# Patient Record
Sex: Male | Born: 1995 | Race: White | Hispanic: Yes | Marital: Single
Health system: Southern US, Community
[De-identification: ages and names within clinical notes are randomized; demographics above are authoritative.]

---
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
## 2015-12-22 ENCOUNTER — Emergency Department (HOSPITAL_COMMUNITY)
Admission: EM | Admit: 2015-12-22 | Discharge: 2015-12-22 | Disposition: A | Discharge status: Home / Self Care | Payer: Medicaid Other | Attending: Emergency Medicine | Admitting: Emergency Medicine

## 2015-12-22 ENCOUNTER — Encounter (HOSPITAL_COMMUNITY): Payer: Self-pay | Admitting: Emergency Medicine

## 2015-12-22 DIAGNOSIS — L259 Unspecified contact dermatitis, unspecified cause: Secondary | ICD-10-CM

## 2015-12-22 DIAGNOSIS — Z792 Long term (current) use of antibiotics: Secondary | ICD-10-CM

## 2015-12-22 DIAGNOSIS — F1290 Cannabis use, unspecified, uncomplicated: Secondary | ICD-10-CM

## 2015-12-22 DIAGNOSIS — L237 Allergic contact dermatitis due to plants, except food: Principal | ICD-10-CM

## 2015-12-22 MED ORDER — CEPHALEXIN 500 MG PO CAPS
500.0000 mg | ORAL_CAPSULE | Freq: Once | ORAL | Status: AC
  Administered 2015-12-22: 500 mg via ORAL
  Filled 2015-12-22: qty 1

## 2015-12-22 MED ORDER — CEPHALEXIN 500 MG PO CAPS: 500.0000 mg | ORAL_CAPSULE | Freq: Three times a day (TID) | ORAL | 0 refills | Status: DC

## 2015-12-22 MED ORDER — PREDNISONE 20 MG PO TABS: 40.0000 mg | ORAL_TABLET | Freq: Every day | ORAL | 0 refills | Status: DC

## 2015-12-22 MED ORDER — PREDNISONE 20 MG PO TABS
40.0000 mg | ORAL_TABLET | Freq: Once | ORAL | Status: AC
  Administered 2015-12-22: 40 mg via ORAL
  Filled 2015-12-22: qty 2

## 2015-12-22 NOTE — Discharge Instructions (Signed)
Take both medications as prescribed until you finish both bottles. Keep the rash covered and use an antibiotic ointment to prevent infection. Do not scratch or pop the blisters. Follow up with dermatology if your rash does not improve.

## 2015-12-22 NOTE — ED Provider Notes (Signed)
[facility]-EMERGENCY DEPT Provider Note   CSN: [id number] Arrival date & time: 12/22/15  [id number]  By signing my name below, I, Nelwyn SalisburyJoshua Fowler, attest that this documentation has been prepared under the direction and in the presence of non-physician practitioner, Noelle PennerSerena Lene Mckay, PA-C. Electronically Signed: Nelwyn SalisburyJoshua Fowler, Scribe. 12/22/2015. 4:47 PM.   History   Chief Complaint Chief Complaint  Patient presents with  . Rash   The history is provided by the patient. No language interpreter was used.    HPI Comments:  Dylan Meyer is a 20 y.o. male who presents to the Emergency Department complaining of constant worsening right forearm rash onset 4 days ago. He reports associated redness and swelling to the area. He denies itching, fever, chills or pain. He has tried topical cream to relieve symptoms with no relief. He reports the rash started small, but worsened when he scratched his arm popped one of the blisters. Pt states that he was working on a room when he stepped down from a ladder and made contact with an unknown plant. He reports symptoms appeared the next day.  States it does not itch and is not painful.  History reviewed. No pertinent past medical history.  There are no active problems to display for this patient.   History reviewed. No pertinent surgical history.  Home Medications    Prior to Admission medications   Medication Sig Start Date End Date Taking? Authorizing Provider  azithromycin (ZITHROMAX) 250 MG tablet Take 1 tablet (250 mg total) by mouth daily. Take first 2 tablets together, then 1 every day until finished. 12/30/12   Tiffany Neva SeatGreene, PA-C  guaiFENesin-codeine 100-10 MG/5ML syrup Take 5 mLs by mouth at bedtime as needed for cough. 12/30/12   Marlon Peliffany Greene, PA-C    Family History No family history on file.  Social History Social History  Substance Use Topics  . Smoking status: Never Smoker  . Smokeless tobacco: Never Used  . Alcohol use 3.0 oz/week    5 Shots  of liquor per week     Allergies   Review of patient's allergies indicates no known allergies.   Review of Systems Review of Systems  Constitutional: Negative for chills and fever.  Skin: Positive for rash.   10 Systems reviewed and are negative for acute change except as noted in the HPI.  Physical Exam Updated Vital Signs BP 125/75 (BP Location: Right Arm)   Pulse 80   Temp 98.2 F (36.8 C) (Oral)   Resp 18   Ht 5\' 11"  (1.803 m)   Wt 225 lb (102.1 kg)   SpO2 100%   BMI 31.38 kg/m   Physical Exam  Constitutional: He is oriented to person, place, and time. He appears well-developed and well-nourished. No distress.  HENT:  Head: Normocephalic and atraumatic.  Eyes: Conjunctivae are normal.  Cardiovascular: Normal rate.   Pulmonary/Chest: Effort normal.  Abdominal: He exhibits no distension.  Neurological: He is alert and oriented to person, place, and time.  Skin:  Right dorsal forearm covered from elbow to wrist in large erythematous plaque with overlying vesicles, some in clusters. Some have ruptured no crusting. No pustules. Few more scattered vesicles on back of hand and on posterior elbow. Nontender. Pt states is not itchy. No other rashes.  Psychiatric: He has a normal mood and affect.  Nursing note and vitals reviewed.    ED Treatments / Results  DIAGNOSTIC STUDIES:  Oxygen Saturation is 100% on RA, normal by my interpretation.    COORDINATION OF CARE:  4:47 PM Discussed treatment plan with pt at bedside which included prednisone, bacitracin, and keflex and pt agreed to plan.  Labs (all labs ordered are listed, but only abnormal results are displayed) Labs Reviewed - No data to display  EKG  EKG Interpretation None       Radiology No results found.  Procedures Procedures (including critical care time)  Medications Ordered in ED Medications - No data to display   Initial Impression / Assessment and Plan / ED Course  I have reviewed the  triage vital signs and the nursing notes.  Pertinent labs & imaging results that were available during my care of the patient were reviewed by me and considered in my medical decision making (see chart for details).  Clinical Course    Based on history and distribution certainly seems like contact dermatitis, especially since it spread after pt scratched initial eruption. However, without significant itching seems unlikely to be poison ivy, poison oak. Possibly sumac? Does not appear infectious (e.g. Don't think shingles) but will cover for superimposed bacterial infection with keflex. Instructed to avoid offending agent, keep covered with bacitracin, and to use unscented soaps, lotions, and detergents. Will treat with prednisone and keflex.  No signs of secondary infection. Follow up with PCP or derm in 2-3 days. Return precautions discussed. Pt is safe for discharge at this time.  Final Clinical Impressions(s) / ED Diagnoses   Final diagnoses:  Contact dermatitis  Plant allergic contact dermatitis    New Prescriptions Discharge Medication List as of 12/22/2015  5:28 PM    START taking these medications   Details  cephALEXin (KEFLEX) 500 MG capsule Take 1 capsule (500 mg total) by mouth 3 (three) times daily., Starting Sat 12/22/2015, Print    predniSONE (DELTASONE) 20 MG tablet Take 2 tablets (40 mg total) by mouth daily., Starting Sat 12/22/2015, Print      I personally performed the services described in this documentation, which was scribed in my presence. The recorded information has been reviewed and is accurate.   Carlene CoriaSerena Y Loredana Medellin, PA-C 12/22/15 1836    Derwood KaplanAnkit Nanavati, MD 12/23/15 [id number]

## 2015-12-22 NOTE — ED Triage Notes (Signed)
Pt from home with what he states is poison ivy on his right forearm. Pt has blister like areas around the entire rash which covers most of the medial side of his forearm. Pt denies pain or itching on the area. Pt states he has been putting oil on the area, but is unsure what the oil is. Pt states the rash appeared on Wed

## 2019-01-26 ENCOUNTER — Other Ambulatory Visit: Payer: Self-pay

## 2019-01-26 ENCOUNTER — Encounter (HOSPITAL_COMMUNITY): Payer: Self-pay

## 2019-01-26 DIAGNOSIS — F1210 Cannabis abuse, uncomplicated: Secondary | ICD-10-CM | POA: Insufficient documentation

## 2019-01-26 DIAGNOSIS — N451 Epididymitis: Secondary | ICD-10-CM | POA: Insufficient documentation

## 2019-01-26 DIAGNOSIS — Z202 Contact with and (suspected) exposure to infections with a predominantly sexual mode of transmission: Secondary | ICD-10-CM | POA: Insufficient documentation

## 2019-01-26 NOTE — ED Triage Notes (Signed)
Pt reports R testicle pain x 2.5 weeks. Endorses swelling, denies any hard lumps. Denies urinary symptoms. States that he was being seen at a clinic, but he never got his urine results.

## 2019-01-27 ENCOUNTER — Emergency Department (HOSPITAL_COMMUNITY): Payer: Self-pay

## 2019-01-27 ENCOUNTER — Emergency Department (HOSPITAL_COMMUNITY)
Admission: EM | Admit: 2019-01-27 | Discharge: 2019-01-27 | Disposition: A | Discharge status: Home / Self Care | Payer: Self-pay | Attending: Emergency Medicine | Admitting: Emergency Medicine

## 2019-01-27 ENCOUNTER — Other Ambulatory Visit: Payer: Self-pay | Admitting: Emergency Medicine

## 2019-01-27 DIAGNOSIS — N451 Epididymitis: Secondary | ICD-10-CM

## 2019-01-27 DIAGNOSIS — Z711 Person with feared health complaint in whom no diagnosis is made: Secondary | ICD-10-CM

## 2019-01-27 LAB — URINALYSIS, ROUTINE W REFLEX MICROSCOPIC
Bilirubin Urine: NEGATIVE
Glucose, UA: NEGATIVE mg/dL
Ketones, ur: NEGATIVE mg/dL
Leukocytes,Ua: NEGATIVE
Nitrite: NEGATIVE
Protein, ur: NEGATIVE mg/dL
Specific Gravity, Urine: 1.03 (ref 1.005–1.030)
pH: 5 (ref 5.0–8.0)

## 2019-01-27 MED ORDER — AZITHROMYCIN 250 MG PO TABS
1000.0000 mg | ORAL_TABLET | Freq: Once | ORAL | Status: AC
  Administered 2019-01-27: 01:00:00 1000 mg via ORAL
  Filled 2019-01-27: qty 4

## 2019-01-27 MED ORDER — STERILE WATER FOR INJECTION IJ SOLN
INTRAMUSCULAR | Status: AC
  Administered 2019-01-27: 01:00:00 0.9 mL
  Filled 2019-01-27: qty 10

## 2019-01-27 MED ORDER — CEFTRIAXONE SODIUM 250 MG IJ SOLR
250.0000 mg | Freq: Once | INTRAMUSCULAR | Status: AC
  Administered 2019-01-27: 01:00:00 250 mg via INTRAMUSCULAR
  Filled 2019-01-27: qty 250

## 2019-01-27 MED ORDER — DOXYCYCLINE HYCLATE 100 MG PO CAPS
100.0000 mg | ORAL_CAPSULE | Freq: Two times a day (BID) | ORAL | 0 refills | Status: AC
Start: 2019-01-27 — End: 2019-02-06

## 2019-01-27 NOTE — ED Provider Notes (Addendum)
[facility]-EMERGENCY DEPT Provider Note   CSN: [id number] Arrival date & time: 01/26/19  1959     History   Chief Complaint Chief Complaint  Patient presents with  . Testicle Pain    HPI Dylan Meyer is a 23 y.o. male presents today for evaluation of right-sided posterior testicular pain for the last 2-1/2 weeks.  Patient admits that he has had one unprotected sexual encounter with a male before symptoms began.  He went to an outside clinic a week and a half ago where they got a urine sample and gave him a medicine for his symptoms that started with an in and had an accident but he does not remember the name exactly.  He took this medicine and while he was taking it his pain improved but he finished the course and the pain returned.  He called the clinic to follow-up on his urine test and they told him that they had actually never sent it out for testing.  Eventually, the clinic called them and told him that he had "mild gonorrhea".  States they never treated it informed.  His friend told him to come to the ER because the test would be done quicker.  He had a quick "twinge" to the right lower abdomen while he was waiting in the waiting room but this has completely resolved.  He has no associated fever, chills, nausea, vomiting, dysuria, hematuria, penile discharge, genital lesions.  States initially he felt like his right testicle was swollen but this has resolved.  No previous history of STDs.     HPI  History reviewed. No pertinent past medical history.  There are no active problems to display for this patient.   History reviewed. No pertinent surgical history.      Home Medications    Prior to Admission medications   Medication Sig Start Date End Date Taking? Authorizing Provider  naproxen (NAPROSYN) 500 MG tablet Take 500 mg by mouth 2 (two) times daily with a meal.   Yes [provider]  azithromycin (ZITHROMAX) 250 MG tablet Take 1 tablet  (250 mg total) by mouth daily. Take first 2 tablets together, then 1 every day until finished. Patient not taking: Reported on 01/27/2019 12/30/12   Marlon Pel, PA-C  cephALEXin (KEFLEX) 500 MG capsule Take 1 capsule (500 mg total) by mouth 3 (three) times daily. Patient not taking: Reported on 01/27/2019 12/22/15   Sam, Ace Gins, PA-C  doxycycline (VIBRAMYCIN) 100 MG capsule Take 1 capsule (100 mg total) by mouth 2 (two) times daily for 10 days. 01/27/19 02/06/19  Liberty Handy, PA-C  guaiFENesin-codeine 100-10 MG/5ML syrup Take 5 mLs by mouth at bedtime as needed for cough. Patient not taking: Reported on 01/27/2019 12/30/12   Marlon Pel, PA-C  predniSONE (DELTASONE) 20 MG tablet Take 2 tablets (40 mg total) by mouth daily. Patient not taking: Reported on 01/27/2019 12/22/15   Carlene Coria, PA-C    Family History History reviewed. No pertinent family history.  Social History Social History   Tobacco Use  . Smoking status: Never Smoker  . Smokeless tobacco: Never Used  Substance Use Topics  . Alcohol use: Yes    Alcohol/week: 5.0 standard drinks    Types: 5 Shots of liquor per week  . Drug use: Yes    Types: Marijuana     Allergies   Patient has no known allergies.   Review of Systems Review of Systems  Genitourinary: Positive for testicular pain.  All  other systems reviewed and are negative.    Physical Exam Updated Vital Signs BP 130/67   Pulse 64   Temp 98.4 F (36.9 C) (Oral)   Resp 16   SpO2 100%   Physical Exam Vitals signs and nursing note reviewed.  Constitutional:      General: He is not in acute distress.    Appearance: He is well-developed.     Comments: NAD.  HENT:     Head: Normocephalic and atraumatic.     Right Ear: External ear normal.     Left Ear: External ear normal.     Nose: Nose normal.  Eyes:     General: No scleral icterus.    Conjunctiva/sclera: Conjunctivae normal.  Neck:     Musculoskeletal: Normal range of motion and  neck supple.  Cardiovascular:     Rate and Rhythm: Normal rate and regular rhythm.     Heart sounds: Normal heart sounds. No murmur.  Pulmonary:     Effort: Pulmonary effort is normal.     Breath sounds: Normal breath sounds. No wheezing.  Abdominal:     Palpations: Abdomen is soft.     Tenderness: There is no abdominal tenderness.     Comments: No lower abdominal tenderness.  No suprapubic or CVA tenderness.  Genitourinary:    Penis: Uncircumcised.      Scrotum/Testes:        Right: Tenderness present.     Comments:  Exam performed with EMT Carmen at bedside External genitalia without erythema, edema or lesions.  Uncircumcised male. No groin lymphadenopathy.  No meatus discharge. Glans and shaft smooth without tenderness, lesions, masses or deformity.  Scrotum without lesions or edema. Non tender LEFT testicle, no pain with traction. Posterior RIGHT testicular, epididymis and spermatic cord tenderness, pain with traction. Cremasteric reflex intact. Musculoskeletal: Normal range of motion.        General: No deformity.  Skin:    General: Skin is warm and dry.     Capillary Refill: Capillary refill takes less than 2 seconds.  Neurological:     Mental Status: He is alert and oriented to person, place, and time.  Psychiatric:        Behavior: Behavior normal.        Thought Content: Thought content normal.        Judgment: Judgment normal.      ED Treatments / Results  Labs (all labs ordered are listed, but only abnormal results are displayed) Labs Reviewed  URINALYSIS, ROUTINE W REFLEX MICROSCOPIC - Abnormal; Notable for the following components:      Result Value   Hgb urine dipstick SMALL (*)    Bacteria, UA RARE (*)    All other components within normal limits  URINE CULTURE  GC/CHLAMYDIA PROBE AMP ([place]) NOT [facility]    EKG None  Radiology [place] Scrotum W/doppler  Result Date: 01/27/2019 CLINICAL DATA:  Initial evaluation for acute right testicular pain.  EXAM: SCROTAL ULTRASOUND DOPPLER ULTRASOUND OF THE TESTICLES TECHNIQUE: Complete ultrasound examination of the testicles, epididymis, and other scrotal structures was performed. Color and spectral Doppler ultrasound were also utilized to evaluate blood flow to the testicles. COMPARISON:  None. FINDINGS: Right testicle Measurements: 4.3 x 2.4 x 2.9 cm. No mass or microlithiasis visualized. Left testicle Measurements: 3.9 x 2.0 x 3.2 cm. No mass or microlithiasis visualized. Right epididymis:  Normal in size and appearance. Left epididymis:  Normal in size and appearance. Hydrocele:  None visualized. Varicocele:  None visualized.  Pulsed Doppler interrogation of both testes demonstrates normal low resistance arterial and venous waveforms bilaterally. IMPRESSION: Normal scrotal ultrasound. No evidence for torsion or other acute abnormality. Electronically Signed   By: Jeannine Boga M.D.   On: 01/27/2019 02:27    Procedures Procedures (including critical care time)  Medications Ordered in ED Medications  cefTRIAXone (ROCEPHIN) injection 250 mg (250 mg Intramuscular Given 01/27/19 0124)  azithromycin (ZITHROMAX) tablet 1,000 mg (1,000 mg Oral Given 01/27/19 0123)  sterile water (preservative free) injection (0.9 mLs  Given 01/27/19 0127)     Initial Impression / Assessment and Plan / ED Course  I have reviewed the triage vital signs and the nursing notes.  Pertinent labs & imaging results that were available during my care of the patient were reviewed by me and considered in my medical decision making (see chart for details).  Symptoms likely from infectious epididymitis from an STD.  Attempted to review patient's EMR but unable to obtain records from the outside clinic he went to.  States he was told he had "mild gonorrhea".  It does not sound like he actually got treated for it.  Sounds like he was discharged with naproxen.  Ultrasound is negative for torsion or other anatomical abnormalities on  the testicle.  UA without signs of infection, trichomonas.  Gonorrhea and chlamydia swabs obtained by me and pending.  He was empirically treated with Rocephin and azithromycin.  I offered HIV and syphilis testing but he declined.  History and exam is not consistent with torsion.  There is no signs of skin infection, cellulitis or abscess on exam.  No signs of foreign years.  No obvious hernias.  He has no abdominal tenderness or inguinal bulging.  Will discharge with NSAIDs, doxycycline for suspected epididymitis.  Instructed patient to avoid sexual encounters for the next 10 days.  He is to notify all sexual partners about his symptoms.  Return precautions given.  Patient comfortable with this.  0345: I realized I did not prescribed doxycycline by time patient was discharged.  Rx for doxy called into pharmacy.  I called patient and left a voicemail to notify.  Final Clinical Impressions(s) / ED Diagnoses   Final diagnoses:  Epididymitis  Concern about STD in male without diagnosis    ED Discharge Orders         Ordered    doxycycline (VIBRAMYCIN) 100 MG capsule  2 times daily     01/27/19 0340             Kinnie Feil, PA-C 01/27/19 7371    Rolland Porter, MD 01/27/19 [phone]

## 2019-01-27 NOTE — Discharge Instructions (Addendum)
You were seen in the ER for right testicular pain.  Urinalysis obtained here does not show any infection.  Gonorrhea and Chlamydia tests are pending.  These usually come back in the next 48 to 72 hours.  You were treated for these infections given your recent exposure to unprotected sex.  No further treatment is needed for gonorrhea and chlamydia.  Someone should call you with your test results if they are positive.  Check my chart account to follow-up in your results.  Ultrasound of your testicles is normal.  I suspect your pain is from an infection to your epididymis which is in the back of your testicle.  Alternate ibuprofen and Tylenol every 6-8 hours for pain.  Return to the ER for fever, chills, nausea, vomiting, abdominal pain, worsening testicular pain, burning with urination, blood in your urine, penile discharge.  Do not have sexual encounters for the next 10 days after receiving treatment today.  You should contact all of your sexual partners to let them know they have been tested for STDs and not having some symptoms as they will need to get tested and treated.

## 2019-01-28 ENCOUNTER — Encounter (HOSPITAL_COMMUNITY): Payer: Self-pay | Admitting: *Deleted

## 2019-01-28 LAB — URINE CULTURE: Culture: NO GROWTH

## 2019-01-28 LAB — CYTOLOGY, (ORAL, ANAL, URETHRAL) ANCILLARY ONLY
Chlamydia: NEGATIVE
Neisseria Gonorrhea: NEGATIVE

## 2019-02-22 ENCOUNTER — Other Ambulatory Visit: Payer: Self-pay

## 2019-02-22 ENCOUNTER — Emergency Department (HOSPITAL_COMMUNITY): Payer: Self-pay

## 2019-02-22 ENCOUNTER — Encounter (HOSPITAL_COMMUNITY): Payer: Self-pay | Admitting: *Deleted

## 2019-02-22 ENCOUNTER — Emergency Department (HOSPITAL_COMMUNITY)
Admission: EM | Admit: 2019-02-22 | Discharge: 2019-02-22 | Disposition: A | Discharge status: Home / Self Care | Payer: Self-pay | Attending: Emergency Medicine | Admitting: Emergency Medicine

## 2019-02-22 DIAGNOSIS — N50812 Left testicular pain: Secondary | ICD-10-CM | POA: Insufficient documentation

## 2019-02-22 DIAGNOSIS — N50811 Right testicular pain: Secondary | ICD-10-CM | POA: Insufficient documentation

## 2019-02-22 DIAGNOSIS — Z79899 Other long term (current) drug therapy: Secondary | ICD-10-CM | POA: Insufficient documentation

## 2019-02-22 LAB — URINALYSIS, ROUTINE W REFLEX MICROSCOPIC
Bilirubin Urine: NEGATIVE
Glucose, UA: NEGATIVE mg/dL
Hgb urine dipstick: NEGATIVE
Ketones, ur: NEGATIVE mg/dL
Leukocytes,Ua: NEGATIVE
Nitrite: NEGATIVE
Protein, ur: NEGATIVE mg/dL
Specific Gravity, Urine: 1.02 (ref 1.005–1.030)
pH: 5 (ref 5.0–8.0)

## 2019-02-22 MED ORDER — KETOROLAC TROMETHAMINE 15 MG/ML IJ SOLN
15.0000 mg | Freq: Once | INTRAMUSCULAR | Status: AC
  Administered 2019-02-22: 20:00:00 15 mg via INTRAMUSCULAR
  Filled 2019-02-22: qty 1

## 2019-02-22 NOTE — ED Provider Notes (Signed)
[facility]   CSN: [id number] Arrival date & time: 02/22/19  1833     History   Chief Complaint Chief Complaint  Patient presents with  . SEXUALLY TRANSMITTED DISEASE    HPI Dylan Meyer is a 23 y.o. male.     HPI 23 year old male presents to the ER for evaluation of bilateral testicular pain.  Has been ongoing intermittently for the past 2 weeks.  Patient was seen in the ER on 9/23 for the same pain.  He was diagnosed with possible epididymitis at this time.  Ultrasound was reassuring.  Patient states that he was sexually active without any protection.  Was treated for gonorrhea and chlamydia.  Culture results were never completed.  Patient states that the pain in his testicles is bilateral.  He states that it will come and go.  He states that he has been sexually active since his last ER visit however he did use protection.  Denies any testicular swelling.  Denies any penile discharge.  Denies any fever, chills or significant abdominal pain.  He has tried no medications for his pain prior to arrival. History reviewed. No pertinent past medical history.  There are no active problems to display for this patient.   History reviewed. No pertinent surgical history.      Home Medications    Prior to Admission medications   Medication Sig Start Date End Date Taking? Authorizing Provider  azithromycin (ZITHROMAX) 250 MG tablet Take 1 tablet (250 mg total) by mouth daily. Take first 2 tablets together, then 1 every day until finished. Patient not taking: Reported on 01/27/2019 12/30/12   Marlon Pel, PA-C  cephALEXin (KEFLEX) 500 MG capsule Take 1 capsule (500 mg total) by mouth 3 (three) times daily. Patient not taking: Reported on 01/27/2019 12/22/15   Sam, Ace Gins, PA-C  guaiFENesin-codeine 100-10 MG/5ML syrup Take 5 mLs by mouth at bedtime as needed for cough. Patient not taking: Reported on 01/27/2019 12/30/12   Marlon Pel, PA-C  naproxen (NAPROSYN) 500 MG tablet Take 500 mg by mouth 2 (two) times daily with a meal.    [provider]  predniSONE (DELTASONE) 20 MG tablet Take 2 tablets (40 mg total) by mouth daily. Patient not taking: Reported on 01/27/2019 12/22/15   Carlene Coria, PA-C    Family History No family history on file.  Social History Social History   Tobacco Use  . Smoking status: Never Smoker  . Smokeless tobacco: Never Used  Substance Use Topics  . Alcohol use: Yes    Alcohol/week: 5.0 standard drinks    Types: 5 Shots of liquor per week  . Drug use: Yes    Types: Marijuana     Allergies   Patient has no known allergies.   Review of Systems Review of Systems  Constitutional: Negative for chills and fever.  HENT: Negative for congestion.   Eyes: Negative for discharge.  Respiratory: Negative for cough.   Gastrointestinal: Negative for nausea and vomiting.  Genitourinary: Positive for testicular pain. Negative for decreased urine volume, difficulty urinating, discharge, dysuria, flank pain, frequency, hematuria, penile pain, scrotal swelling and urgency.  Musculoskeletal: Negative for myalgias.  Skin: Negative for color change.  Psychiatric/Behavioral: Negative for confusion.     Physical Exam Updated Vital Signs BP 139/66 (BP Location: Right Arm)   Pulse 74   Temp 99.2 F (37.3 C) (Oral)   Resp 15   Ht 5\' 9"  (1.753 m)   Wt 99.8  kg   SpO2 100%   BMI 32.49 kg/m   Physical Exam Vitals signs and nursing note reviewed.  Constitutional:      General: He is not in acute distress.    Appearance: He is well-developed. He is not ill-appearing or toxic-appearing.  HENT:     Head: Normocephalic and atraumatic.  Eyes:     General: No scleral icterus.       Right eye: No discharge.        Left eye: No discharge.  Neck:     Musculoskeletal: Normal range of motion.  Pulmonary:     Effort: No respiratory distress.  Abdominal:     General: Abdomen is  flat. Bowel sounds are normal. There is no distension.     Palpations: Abdomen is soft. There is no mass.     Tenderness: There is no abdominal tenderness. There is no right CVA tenderness, left CVA tenderness, guarding or rebound.     Hernia: No hernia is present.  Genitourinary:    Comments: Chaperone present for exam. uncirumcised male. No penile discharge, erythema, tenderness, lesion, or rash. 2 descended testes without swelling, pain, lesions or rash. No inguinal lymphadenopathy or hernia.   Musculoskeletal: Normal range of motion.  Skin:    General: Skin is warm and dry.     Capillary Refill: Capillary refill takes less than 2 seconds.     Coloration: Skin is not pale.  Neurological:     Mental Status: He is alert.  Psychiatric:        Behavior: Behavior normal.        Thought Content: Thought content normal.        Judgment: Judgment normal.      ED Treatments / Results  Labs (all labs ordered are listed, but only abnormal results are displayed) Labs Reviewed  URINALYSIS, ROUTINE W REFLEX MICROSCOPIC - Abnormal; Notable for the following components:      Result Value   Color, Urine STRAW (*)    APPearance HAZY (*)    All other components within normal limits  GC/CHLAMYDIA PROBE AMP ([place]) NOT AT [facility]    EKG None  Radiology [place] Scrotum W/doppler  Result Date: 02/22/2019 CLINICAL DATA:  Left testicular pain for 10 days EXAM: SCROTAL ULTRASOUND DOPPLER ULTRASOUND OF THE TESTICLES TECHNIQUE: Complete ultrasound examination of the testicles, epididymis, and other scrotal structures was performed. Color and spectral Doppler ultrasound were also utilized to evaluate blood flow to the testicles. COMPARISON:  Scrotal ultrasound 01/27/2019 FINDINGS: Right testicle Measurements: 4.3 x 2.4 x 3.1 cm. No mass or microlithiasis visualized. Left testicle Measurements: 4.4 x 2.8 x 2.9 cm. No mass or microlithiasis visualized. Right epididymis: 3 mm anechoic epididymal head cyst.  Otherwise normal size and appearance. Left epididymis:  Normal in size and appearance. Hydrocele:  None visualized. Varicocele:  None visualized. Pulsed Doppler interrogation of both testes demonstrates normal low resistance arterial and venous waveforms bilaterally. IMPRESSION: No evidence of testicular mass, torsion or other acute scrotal abnormality. Small anechoic right epididymal head cyst. Electronically Signed   By: Kreg ShropshirePrice  DeHay M.D.   On: 02/22/2019 20:59    Procedures Procedures (including critical care time)  Medications Ordered in ED Medications  ketorolac (TORADOL) 15 MG/ML injection 15 mg (15 mg Intramuscular Given 02/22/19 2014)     Initial Impression / Assessment and Plan / ED Course  I have reviewed the triage vital signs and the nursing notes.  Pertinent labs & imaging results that were available during my  care of the patient were reviewed by me and considered in my medical decision making (see chart for details).        23 year old male with ongoing testicular pain presents to the ER for further evaluation.  Treated with Rocephin and doxycycline 3 weeks ago for presumed STD however cultures were never obtained.  Ultrasound was reassuring at that time.  Patient said the testicular pain has persisted.  He denies any unprotected sex since his last antibiotic treatment.  On exam patient has no testicular pain or swelling.  There is no penile discharge or rashes noted.  Gonorrhea and Chlamydia cultures were obtained and they are pending at this time.  UA shows no signs of infection.  Ultrasound was obtained that shows no evidence of torsion or epididymitis.  Does note epididymal head cyst.  Unknown etiology of patient's symptoms.  No signs of UTI.  Doubt nephrolithiasis.  Patient has no systemic signs of infection.  Urine shows no signs of infection.  Will treat with anti-inflammatories, scrotal support and ice application.  Will give urology follow-up.  Given the patient was  treated with antibiotics 3 weeks ago do not feel that he needs retreatment and will wait for GC chlamydia cultures.  Pt is hemodynamically stable, in NAD, & able to ambulate in the ED. Evaluation does not show pathology that would require ongoing emergent intervention or inpatient treatment. I explained the diagnosis to the patient. Pain has been managed & has no complaints prior to dc. Pt is comfortable with above plan and is stable for discharge at this time. All questions were answered prior to disposition. Strict return precautions for f/u to the ED were discussed. Encouraged follow up with PCP.   Final Clinical Impressions(s) / ED Diagnoses   Final diagnoses:  Pain in both testicles    ED Discharge Orders    None       Aaron Edelman 02/22/19 2136    Julianne Rice, MD 02/22/19 2229

## 2019-02-22 NOTE — ED Triage Notes (Signed)
Left side testicle pain, no swelling, painful, states he did not follow instructions from the last time he was here

## 2019-02-22 NOTE — Discharge Instructions (Signed)
Your ultrasound showed no signs of swelling to your testicles.  You do have good blood flow and no signs of twisting of the testicles.  You do have a small cyst to your right testicle epididymis this is not likely causing your symptoms.  Given that you were treated for an STD several weeks ago and has had not had any unprotected sex since then I will not treat at this time and await for your cultures of your gonorrhea chlamydia.  Your urine showed no signs of infection.  I would recommend anti-inflammatories.  Avoid sexual intercourse for the next 14 days to allow your symptoms to improve.  Motrin and Tylenol for pain.  Can use a scrotal support to help with the pain and apply ice to help with any swelling.  Have given you urology follow-up and return precautions were discussed.

## 2019-02-24 LAB — GC/CHLAMYDIA PROBE AMP (~~LOC~~) NOT AT ARMC
Chlamydia: NEGATIVE
Neisseria Gonorrhea: NEGATIVE

## 2019-03-01 ENCOUNTER — Emergency Department (HOSPITAL_COMMUNITY)
Admission: EM | Admit: 2019-03-01 | Discharge: 2019-03-02 | Disposition: A | Discharge status: Home / Self Care | Payer: Self-pay | Attending: Emergency Medicine | Admitting: Emergency Medicine

## 2019-03-01 ENCOUNTER — Encounter (HOSPITAL_COMMUNITY): Payer: Self-pay | Admitting: Family Medicine

## 2019-03-01 DIAGNOSIS — F10929 Alcohol use, unspecified with intoxication, unspecified: Secondary | ICD-10-CM | POA: Insufficient documentation

## 2019-03-01 DIAGNOSIS — Y906 Blood alcohol level of 120-199 mg/100 ml: Secondary | ICD-10-CM | POA: Insufficient documentation

## 2019-03-01 DIAGNOSIS — F4321 Adjustment disorder with depressed mood: Secondary | ICD-10-CM | POA: Insufficient documentation

## 2019-03-01 LAB — ETHANOL: Alcohol, Ethyl (B): 172 mg/dL — ABNORMAL HIGH (ref <10)

## 2019-03-01 LAB — BASIC METABOLIC PANEL
Anion gap: 13 (ref 5–15)
BUN: 8 mg/dL (ref 6–20)
CO2: 20 mmol/L — ABNORMAL LOW (ref 22–32)
Calcium: 9.3 mg/dL (ref 8.9–10.3)
Chloride: 107 mmol/L (ref 98–111)
Creatinine, Ser: 0.92 mg/dL (ref 0.61–1.24)
GFR calc Af Amer: >60 mL/min (ref >60)
GFR calc non Af Amer: >60 mL/min (ref >60)
Glucose, Bld: 187 mg/dL — ABNORMAL HIGH (ref 70–99)
Potassium: 2.6 mmol/L — CL (ref 3.5–5.1)
Sodium: 140 mmol/L (ref 135–145)

## 2019-03-01 LAB — CBC
HCT: 42.3 % (ref 39.0–52.0)
Hemoglobin: 13.8 g/dL (ref 13.0–17.0)
MCH: 28.4 pg (ref 26.0–34.0)
MCHC: 32.6 g/dL (ref 30.0–36.0)
MCV: 87 fL (ref 80.0–100.0)
Platelets: 322 10*3/uL (ref 150–400)
RBC: 4.86 MIL/uL (ref 4.22–5.81)
RDW: 13.2 % (ref 11.5–15.5)
WBC: 14.5 10*3/uL — ABNORMAL HIGH (ref 4.0–10.5)
nRBC: 0 % (ref 0.0–0.2)

## 2019-03-01 MED ORDER — POTASSIUM CHLORIDE CRYS ER 20 MEQ PO TBCR
40.0000 meq | EXTENDED_RELEASE_TABLET | Freq: Once | ORAL | Status: AC
  Administered 2019-03-01: 40 meq via ORAL
  Filled 2019-03-01: qty 2

## 2019-03-01 MED ORDER — SODIUM CHLORIDE 0.9 % IV SOLN
1000.0000 mL | INTRAVENOUS | Status: DC
  Administered 2019-03-01: 1000 mL via INTRAVENOUS

## 2019-03-01 MED ORDER — POTASSIUM CHLORIDE 10 MEQ/100ML IV SOLN
10.0000 meq | Freq: Once | INTRAVENOUS | Status: AC
  Administered 2019-03-01: 10 meq via INTRAVENOUS
  Filled 2019-03-01: qty 100

## 2019-03-01 MED ORDER — SODIUM CHLORIDE 0.9 % IV BOLUS (SEPSIS)
1000.0000 mL | Freq: Once | INTRAVENOUS | Status: AC
  Administered 2019-03-01: 1000 mL via INTRAVENOUS

## 2019-03-01 MED ORDER — LORAZEPAM 2 MG/ML IJ SOLN
1.0000 mg | Freq: Once | INTRAMUSCULAR | Status: AC
  Administered 2019-03-01: 1 mg via INTRAVENOUS
  Filled 2019-03-01: qty 1

## 2019-03-01 NOTE — ED Provider Notes (Addendum)
[facility]-EMERGENCY DEPT Provider Note   CSN: [id number] Arrival date & time: 03/01/19  2248     History   Chief Complaint Chief Complaint  Patient presents with  . Unresponsive     HPI Dylan Meyer is a 23 y.o. male.     HPI Patient presented to the emergency room for evaluation of a grief reaction.  Patient states a friend of his recently passed away.  Pt is feeling guilty because he did not answer the phone and the friend committed suicide.  Patient states he has been very upset.  He admits to drinking alcohol tonight.  Patient denies suicidal or homicidal ideation. History reviewed. No pertinent past medical history.  There are no active problems to display for this patient.   History reviewed. No pertinent surgical history.      Home Medications    Prior to Admission medications   Medication Sig Start Date End Date Taking? Authorizing Provider  azithromycin (ZITHROMAX) 250 MG tablet Take 1 tablet (250 mg total) by mouth daily. Take first 2 tablets together, then 1 every day until finished. Patient not taking: Reported on 01/27/2019 12/30/12   Marlon Pel, PA-C  cephALEXin (KEFLEX) 500 MG capsule Take 1 capsule (500 mg total) by mouth 3 (three) times daily. Patient not taking: Reported on 01/27/2019 12/22/15   Sam, Ace Gins, PA-C  guaiFENesin-codeine 100-10 MG/5ML syrup Take 5 mLs by mouth at bedtime as needed for cough. Patient not taking: Reported on 01/27/2019 12/30/12   Marlon Pel, PA-C  naproxen (NAPROSYN) 500 MG tablet Take 500 mg by mouth 2 (two) times daily with a meal.    [provider]  predniSONE (DELTASONE) 20 MG tablet Take 2 tablets (40 mg total) by mouth daily. Patient not taking: Reported on 01/27/2019 12/22/15   Carlene Coria, PA-C    Family History History reviewed. No pertinent family history.  Social History Social History   Tobacco Use  . Smoking status: Never Smoker  . Smokeless tobacco: Never  Used  Substance Use Topics  . Alcohol use: Yes    Comment: Last drink: "up to around 22:00".   . Drug use: Yes    Types: Marijuana    Comment: Last used: today      Allergies   Patient has no known allergies.   Review of Systems Review of Systems  All other systems reviewed and are negative.    Physical Exam Updated Vital Signs BP (!) 98/48   Pulse 67   Temp 97.8 F (36.6 C) (Axillary)   Resp 19   Ht 1.753 m (5\' 9" )   Wt 99.8 kg   SpO2 100%   BMI 32.49 kg/m   Physical Exam Vitals signs and nursing note reviewed.  Constitutional:      General: He is not in acute distress.    Appearance: He is well-developed.  HENT:     Head: Normocephalic and atraumatic.     Right Ear: External ear normal.     Left Ear: External ear normal.  Eyes:     General: No scleral icterus.       Right eye: No discharge.        Left eye: No discharge.     Conjunctiva/sclera: Conjunctivae normal.  Neck:     Musculoskeletal: Neck supple.     Trachea: No tracheal deviation.  Cardiovascular:     Rate and Rhythm: Normal rate and regular rhythm.  Pulmonary:     Effort: Pulmonary effort is  normal. No respiratory distress.     Breath sounds: Normal breath sounds. No stridor. No wheezing or rales.  Abdominal:     General: Bowel sounds are normal. There is no distension.     Palpations: Abdomen is soft.     Tenderness: There is no abdominal tenderness. There is no guarding or rebound.  Musculoskeletal:        General: No tenderness.  Skin:    General: Skin is warm and dry.     Findings: No rash.  Neurological:     Mental Status: He is alert.     Cranial Nerves: No cranial nerve deficit (no facial droop, extraocular movements intact, no slurred speech).     Sensory: No sensory deficit.     Motor: No abnormal muscle tone or seizure activity.     Coordination: Coordination normal.     Comments: Initially the patient was more somnolent but now he is alert and agitated  Psychiatric:         Mood and Affect: Mood is anxious and depressed. Affect is labile.        Behavior: Behavior is withdrawn and combative. Behavior is not aggressive or hyperactive.        Thought Content: Thought content does not include homicidal or suicidal ideation.      ED Treatments / Results  Labs (all labs ordered are listed, but only abnormal results are displayed) Labs Reviewed  CBC - Abnormal; Notable for the following components:      Result Value   WBC 14.5 (*)    All other components within normal limits  BASIC METABOLIC PANEL - Abnormal; Notable for the following components:   Potassium 2.6 (*)    CO2 20 (*)    Glucose, Bld 187 (*)    All other components within normal limits  ETHANOL - Abnormal; Notable for the following components:   Alcohol, Ethyl (B) 172 (*)    All other components within normal limits  RAPID URINE DRUG SCREEN, HOSP PERFORMED    EKG None  Radiology No results found.  Procedures Procedures (including critical care time)  Medications Ordered in ED Medications  sodium chloride 0.9 % bolus 1,000 mL (0 mLs Intravenous Stopped 03/01/19 2335)    Followed by  0.9 %  sodium chloride infusion (1,000 mLs Intravenous New Bag/Given (Non-Interop) 03/01/19 2305)  LORazepam (ATIVAN) injection 1 mg (1 mg Intravenous Given 03/01/19 2307)  potassium chloride SA (KLOR-CON) CR tablet 40 mEq (40 mEq Oral Given 03/01/19 2352)  potassium chloride 10 mEq in 100 mL IVPB (0 mEq Intravenous Stopped 03/02/19 0050)     Initial Impression / Assessment and Plan / ED Course  I have reviewed the triage vital signs and the nursing notes.  Pertinent labs & imaging results that were available during my care of the patient were reviewed by me and considered in my medical decision making (see chart for details).  Clinical Course as of Mar 02 115  Tue Mar 01, 2019  2339 Labs reviewed.  Potassium decreased at 2.6.  Alcohol level elevated at 172   [JK]  Wed Mar 02, 2019  0110 Pt was  hyperventilating prior to blood draw.  Suspect that contributed to the low potassium   [JK]  0115 Patient still suicidal ideation.  I asked him if he wanted to speak with a behavioral health specialist and he declined.   [JK]    Clinical Course User Index [JK] Linwood DibblesKnapp, Kirsten Mckone, MD     Patient's laboratory tests  are notable for alcohol intoxication and hypokalemia.  IV and oral potassium ordered.  Patient is calmer now.  He denied suicidal ideation to me but has been very upset about his friend who passed away.  Will continue to monitor until he is more sober.  Final Clinical Impressions(s) / ED Diagnoses   Final diagnoses:  Alcoholic intoxication with complication ([place])  Grief reaction      Dorie Rank, MD 03/01/19 0370    Dorie Rank, MD 03/02/19 [phone]

## 2019-03-01 NOTE — ED Triage Notes (Signed)
Patient was dropped off by his friends. Patient was unresponsive when he arrived, pale, and diaphoretic. When he got to RES A, he became responsive with some stimulation. He reports he has been drinking tonight related to a friend committing suicide recently. He denies wanting to hurt himself or others.

## 2019-03-01 NOTE — ED Notes (Signed)
Pt placed on 3L nasal cannula due to pt desating to 84% on room air.

## 2019-03-02 NOTE — ED Notes (Signed)
Patient ambulated to the restroom with no difficulty   ° °

## 2019-03-02 NOTE — Discharge Instructions (Addendum)
Please refer to the discharge instructions

## 2019-03-02 NOTE — ED Notes (Signed)
Will hold discharge until patient is awake and more alert. Anderson Malta, RN (Camera operator) is aware and agrees to holding patient until he is more alert.

## 2019-04-25 ENCOUNTER — Emergency Department (HOSPITAL_COMMUNITY): Payer: Self-pay

## 2019-04-25 ENCOUNTER — Encounter (HOSPITAL_COMMUNITY): Payer: Self-pay

## 2019-04-25 ENCOUNTER — Other Ambulatory Visit: Payer: Self-pay

## 2019-04-25 ENCOUNTER — Emergency Department (HOSPITAL_COMMUNITY)
Admission: EM | Admit: 2019-04-25 | Discharge: 2019-04-26 | Disposition: A | Discharge status: Home / Self Care | Payer: Self-pay | Attending: Emergency Medicine | Admitting: Emergency Medicine

## 2019-04-25 DIAGNOSIS — N503 Cyst of epididymis: Secondary | ICD-10-CM | POA: Insufficient documentation

## 2019-04-25 DIAGNOSIS — Z79899 Other long term (current) drug therapy: Secondary | ICD-10-CM | POA: Insufficient documentation

## 2019-04-25 DIAGNOSIS — N50819 Testicular pain, unspecified: Secondary | ICD-10-CM

## 2019-04-25 LAB — URINALYSIS, ROUTINE W REFLEX MICROSCOPIC
Bilirubin Urine: NEGATIVE
Glucose, UA: NEGATIVE mg/dL
Hgb urine dipstick: NEGATIVE
Ketones, ur: NEGATIVE mg/dL
Leukocytes,Ua: NEGATIVE
Nitrite: NEGATIVE
Protein, ur: NEGATIVE mg/dL
Specific Gravity, Urine: 1.021 (ref 1.005–1.030)
pH: 6 (ref 5.0–8.0)

## 2019-04-25 NOTE — ED Provider Notes (Signed)
[place] DEPT Provider Note   CSN: [id number] Arrival date & time: 04/25/19  2046     History Chief Complaint  Patient presents with  . Testicle Pain    Kristoffer Alvin Diffee is a 23 y.o. male.  The history is provided by the patient and medical records.  Testicle Pain   24 year old male presenting to the ED with testicle pain.  States this is been ongoing for about a week.  States initially right testicle was very swollen but he has gotten that to decrease with applying ice.  He continues to have some soreness in the right testicle.  He denies any difficulty urinating, penile discharge, penile lesions, or fever.  He denies any trauma to the testicle or groin.  No medication prior to arrival.  History reviewed. No pertinent past medical history.  There are no problems to display for this patient.   History reviewed. No pertinent surgical history.     History reviewed. No pertinent family history.  Social History   Tobacco Use  . Smoking status: Never Smoker  . Smokeless tobacco: Never Used  Substance Use Topics  . Alcohol use: Yes    Comment: Last drink: "up to around 22:00".   . Drug use: Yes    Types: Marijuana    Comment: Last used: today     Home Medications Prior to Admission medications   Medication Sig Start Date End Date Taking? Authorizing Provider  azithromycin (ZITHROMAX) 250 MG tablet Take 1 tablet (250 mg total) by mouth daily. Take first 2 tablets together, then 1 every day until finished. Patient not taking: Reported on 01/27/2019 12/30/12   Delos Haring, PA-C  cephALEXin (KEFLEX) 500 MG capsule Take 1 capsule (500 mg total) by mouth 3 (three) times daily. Patient not taking: Reported on 01/27/2019 12/22/15   Sam, Olivia Canter, PA-C  guaiFENesin-codeine 100-10 MG/5ML syrup Take 5 mLs by mouth at bedtime as needed for cough. Patient not taking: Reported on 01/27/2019 12/30/12   Delos Haring, PA-C  naproxen (NAPROSYN)  500 MG tablet Take 500 mg by mouth 2 (two) times daily with a meal.    [provider]  predniSONE (DELTASONE) 20 MG tablet Take 2 tablets (40 mg total) by mouth daily. Patient not taking: Reported on 01/27/2019 12/22/15   Anne Ng, PA-C    Allergies    Patient has no known allergies.  Review of Systems   Review of Systems  Genitourinary: Positive for testicular pain.  All other systems reviewed and are negative.   Physical Exam Updated Vital Signs BP 132/78 (BP Location: Left Arm)   Pulse 68   Temp 99.7 F (37.6 C) (Oral)   Resp 16   Ht 5\' 9"  (1.753 m)   Wt 100.8 kg   SpO2 100%   BMI 32.83 kg/m   Physical Exam Vitals and nursing note reviewed. Exam conducted with a chaperone present.  Constitutional:      Appearance: He is well-developed.  HENT:     Head: Normocephalic and atraumatic.  Eyes:     Conjunctiva/sclera: Conjunctivae normal.     Pupils: Pupils are equal, round, and reactive to light.  Cardiovascular:     Rate and Rhythm: Normal rate and regular rhythm.     Heart sounds: Normal heart sounds.  Pulmonary:     Effort: Pulmonary effort is normal.     Breath sounds: Normal breath sounds.  Abdominal:     General: Bowel sounds are normal.  Palpations: Abdomen is soft.  Genitourinary:    Penis: Uncircumcised.      Testes:        Right: Tenderness and swelling present.     Comments: Exam chaperoned by NT Right testicle does appear swollen compared with left but maintains normal lie, mild tenderness, no appreciable penile discharge or bleeding Musculoskeletal:        General: Normal range of motion.     Cervical back: Normal range of motion.  Skin:    General: Skin is warm and dry.  Neurological:     Mental Status: He is alert and oriented to person, place, and time.     ED Results / Procedures / Treatments   Labs (all labs ordered are listed, but only abnormal results are displayed) Labs Reviewed  URINALYSIS, ROUTINE W REFLEX  MICROSCOPIC  GC/CHLAMYDIA PROBE AMP ([place]) NOT AT [facility]    EKG None  Radiology US SCROTUM W/DOPPLER  Result Date: 04/25/2019 CLINICAL DATA:  23 year old male with testicular pain x1 week. EXAM: SCROTAL ULTRASOUND DOPPLER ULTRASOUND OF THE TESTICLES TECHNIQUE: Complete ultrasound examination of the testicles, epididymis, and other scrotal structures was performed. Color and spectral Doppler ultrasound were also utilized to evaluate blood flow to the testicles. COMPARISON:  Scrotal ultrasound dated 02/22/2019 FINDINGS: Right testicle Measurements: 3.6 x 2.0 x 3.1 cm. No mass or microlithiasis visualized. Left testicle Measurements: 3.9 x 2.9 x 2.7 cm. No mass or microlithiasis visualized. Right epididymis: Normal in size and appearance. A 3 mm right epididymal head cyst noted. Left epididymis:  Normal in size and appearance. Hydrocele:  None visualized. Varicocele:  None visualized. Pulsed Doppler interrogation of both testes demonstrates normal low resistance arterial and venous waveforms bilaterally. IMPRESSION: 1. Unremarkable testicles with Doppler detected arterial and venous flow bilaterally. 2. A 3 mm right epididymal head cyst. Electronically Signed   By: Elgie Collard M.D.   On: 04/25/2019 23:29    Procedures Procedures (including critical care time)  Medications Ordered in ED Medications - No data to display  ED Course  I have reviewed the triage vital signs and the nursing notes.  Pertinent labs & imaging results that were available during my care of the patient were reviewed by me and considered in my medical decision making (see chart for details).    MDM Rules/Calculators/A&P  23 y.o. M here with right testicle pain for the past week.  Some swelling but decreased when applying ice.  Afebrile, non-toxic in appearance.  Right testicle with mild swelling compared with left, normal lie.  No penile discharge/bleeding.  UA without signs of infection.  Gc/chl pending.  [place]  with findings of 58mm right epididymal head cyst, otherwise no acute findings.  Will refer to urology for follow-up as it appears he has had prior issues with testicle pain/epididymitis over the past few months.  He may return here for any new/acute changes.  Final Clinical Impression(s) / ED Diagnoses Final diagnoses:  Testicle pain    Rx / DC Orders ED Discharge Orders    None       Garlon Hatchet, PA-C 04/26/19 0000    Tegeler, Canary Brim, MD 04/26/19 [phone]

## 2019-04-25 NOTE — Discharge Instructions (Signed)
Your ultrasound today showed an epididymal head cyst.   Can continue symptomatic treatment with tylenol or motrin. Recommend that you follow-up closely with urology-- call in the morning for appt. Return here for any new/acute changes.

## 2019-04-25 NOTE — ED Triage Notes (Signed)
Pt complains of testicle pain in the right testicle for a week, he states it was swollen but he put ice on it and the swelling is gone now

## 2019-05-05 ENCOUNTER — Ambulatory Visit: Payer: Self-pay | Attending: Internal Medicine

## 2019-05-12 ENCOUNTER — Other Ambulatory Visit: Payer: Self-pay

## 2019-05-12 ENCOUNTER — Emergency Department (HOSPITAL_COMMUNITY)
Admission: EM | Admit: 2019-05-12 | Discharge: 2019-05-13 | Disposition: A | Discharge status: Home / Self Care | Payer: Self-pay | Attending: Emergency Medicine | Admitting: Emergency Medicine

## 2019-05-12 DIAGNOSIS — T401X1A Poisoning by heroin, accidental (unintentional), initial encounter: Secondary | ICD-10-CM | POA: Insufficient documentation

## 2019-05-12 DIAGNOSIS — Z79899 Other long term (current) drug therapy: Secondary | ICD-10-CM | POA: Insufficient documentation

## 2019-05-12 DIAGNOSIS — F1910 Other psychoactive substance abuse, uncomplicated: Secondary | ICD-10-CM | POA: Insufficient documentation

## 2019-05-12 MED ORDER — SODIUM CHLORIDE 0.9 % IV SOLN
1000.0000 mL | INTRAVENOUS | Status: DC
  Administered 2019-05-13: 1000 mL via INTRAVENOUS

## 2019-05-12 MED ORDER — SODIUM CHLORIDE 0.9 % IV BOLUS (SEPSIS)
1000.0000 mL | Freq: Once | INTRAVENOUS | Status: AC
  Administered 2019-05-13: 1000 mL via INTRAVENOUS

## 2019-05-12 MED ORDER — NALOXONE HCL 0.4 MG/ML IJ SOLN: 0.4000 mg | INTRAMUSCULAR | Status: DC | PRN

## 2019-05-12 NOTE — ED Provider Notes (Signed)
[facility]-EMERGENCY DEPT Provider Note  CSN: [id number] Arrival date & time: 05/12/19 2333  Chief Complaint(s) Drug Overdose Zenda Alpers, RN (Registered Nurse) . . 05/12/2019 11:51 PM . . Signed   Pt BIB [facility] from home after pt reports taking 4 Xanax and using heroin for the first time. When EMS arrived, pt RR was 4, family performing CPR. Pt never loss pulses, Narcan was administered and pt became alert. Pt denies SI and states he tried heroin to help with his chronic back pain and "pinched nerve problem."      HPI Dylan Meyer is a 24 y.o. male who presents to the emergency department after being found unresponsive by family.  Patient reported taking 4 Xanax tablets and trying heroin for the first time due to his chronic nerve leg pain.  He denies suicidal ideation.  Denies any other illicit drug use.  Denies any headache, neck pain, back pain, chest pain, shortness of breath, abdominal pain.   Patient found by family.  They began CPR.  When EMS arrived patient's respiratory rate was 4.  Patient had intact pulses.  Responded to Narcan.  Remained hemodynamically stable in route.  HPI  Past Medical History No past medical history on file. There are no problems to display for this patient.  Home Medication(s) Prior to Admission medications   Medication Sig Start Date End Date Taking? Authorizing Provider  azithromycin (ZITHROMAX) 250 MG tablet Take 1 tablet (250 mg total) by mouth daily. Take first 2 tablets together, then 1 every day until finished. Patient not taking: Reported on 01/27/2019 12/30/12   Marlon Pel, PA-C  cephALEXin (KEFLEX) 500 MG capsule Take 1 capsule (500 mg total) by mouth 3 (three) times daily. Patient not taking: Reported on 01/27/2019 12/22/15   Sam, Ace Gins, PA-C  guaiFENesin-codeine 100-10 MG/5ML syrup Take 5 mLs by mouth at bedtime as needed for cough. Patient not taking: Reported on 01/27/2019 12/30/12   Marlon Pel,  PA-C  naproxen (NAPROSYN) 500 MG tablet Take 500 mg by mouth 2 (two) times daily with a meal.    [provider]  predniSONE (DELTASONE) 20 MG tablet Take 2 tablets (40 mg total) by mouth daily. Patient not taking: Reported on 01/27/2019 12/22/15   Derl Barrow                                                                                                                                    Past Surgical History No past surgical history on file. Family History No family history on file.  Social History Social History   Tobacco Use  . Smoking status: Never Smoker  . Smokeless tobacco: Never Used  Substance Use Topics  . Alcohol use: Yes    Comment: Last drink: "up to around 22:00".   . Drug use: Yes    Types: Marijuana    Comment: Last used: today  Allergies Patient has no known allergies.  Review of Systems Review of Systems All other systems are reviewed and are negative for acute change except as noted in the HPI  Physical Exam Vital Signs  I have reviewed the triage vital signs BP 114/70   Pulse 72   Temp 98.9 F (37.2 C) (Oral)   Resp 20   SpO2 99%   Physical Exam Vitals reviewed.  Constitutional:      General: He is not in acute distress.    Appearance: He is well-developed. He is not diaphoretic.  HENT:     Head: Normocephalic and atraumatic.     Nose: Nose normal.  Eyes:     General: No scleral icterus.       Right eye: No discharge.        Left eye: No discharge.     Conjunctiva/sclera: Conjunctivae normal.     Pupils: Pupils are equal, round, and reactive to light.  Cardiovascular:     Rate and Rhythm: Normal rate and regular rhythm.     Heart sounds: No murmur. No friction rub. No gallop.   Pulmonary:     Effort: Pulmonary effort is normal. No respiratory distress.     Breath sounds: Normal breath sounds. No stridor. No rales.  Abdominal:     General: There is no distension.     Palpations: Abdomen is soft.     Tenderness: There  is no abdominal tenderness.  Musculoskeletal:        General: No tenderness.     Cervical back: Normal range of motion and neck supple.  Skin:    General: Skin is warm and dry.     Findings: No erythema or rash.  Neurological:     Mental Status: He is oriented to person, place, and time.     Comments: Sleepy, but easily arousable. Moves all extremities. Follows commands      ED Results and Treatments Labs (all labs ordered are listed, but only abnormal results are displayed) Labs Reviewed  COMPREHENSIVE METABOLIC PANEL - Abnormal; Notable for the following components:      Result Value   Glucose, Bld 138 (*)    ALT 53 (*)    All other components within normal limits  SALICYLATE LEVEL - Abnormal; Notable for the following components:   Salicylate Lvl <2.8 (*)    All other components within normal limits  ACETAMINOPHEN LEVEL - Abnormal; Notable for the following components:   Acetaminophen (Tylenol), Serum <10 (*)    All other components within normal limits  RAPID URINE DRUG SCREEN, HOSP PERFORMED - Abnormal; Notable for the following components:   Benzodiazepines POSITIVE (*)    Tetrahydrocannabinol POSITIVE (*)    All other components within normal limits  CBC WITH DIFFERENTIAL/PLATELET - Abnormal; Notable for the following components:   WBC 12.3 (*)    Neutro Abs 9.5 (*)    All other components within normal limits  ETHANOL  CBG MONITORING, ED  EKG  EKG Interpretation  Date/Time:  Thursday May 12 2019 23:50:20 EST Ventricular Rate:  83 PR Interval:    QRS Duration: 82 QT Interval:  351 QTC Calculation: 413 R Axis:   63 Text Interpretation: Sinus rhythm No significant change since last tracing Confirmed by Drema Pry [phone]) on 05/13/2019 12:02:35 AM      Radiology No results found.  Pertinent labs & imaging results that were  available during my care of the patient were reviewed by me and considered in my medical decision making (see chart for details).  Medications Ordered in ED Medications  sodium chloride 0.9 % bolus 1,000 mL (0 mLs Intravenous Stopped 05/13/19 0245)    Followed by  sodium chloride 0.9 % bolus 1,000 mL (0 mLs Intravenous Stopped 05/13/19 0245)    Followed by  0.9 %  sodium chloride infusion (0 mLs Intravenous Stopped 05/13/19 0347)  naloxone (NARCAN) injection 0.4 mg (has no administration in time range)                                                                                                                                    Procedures Procedures  (including critical care time)  Medical Decision Making / ED Course I have reviewed the nursing notes for this encounter and the patient's prior records (if available in EHR or on provided paperwork).   Dylan Meyer was evaluated in Emergency Department on 05/13/2019 for the symptoms described in the history of present illness. He was evaluated in the context of the global COVID-19 pandemic, which necessitated consideration that the patient might be at risk for infection with the SARS-CoV-2 virus that causes COVID-19. Institutional protocols and algorithms that pertain to the evaluation of patients at risk for COVID-19 are in a state of rapid change based on information released by regulatory bodies including the CDC and federal and state organizations. These policies and algorithms were followed during the patient's care in the ED.  Patient presents as a heroin and benzo overdose.  Responded to 1 dose of Narcan by EMS.  Screening labs and EKG reassuring.  Patient was provided with IV hydration and monitor for several hours.  He did not require any additional doses of Narcan.  Patient metabolize and was able to ambulate without significant complication other than his chronic leg pain.  The patient appears reasonably screened and/or stabilized  for discharge and I doubt any other medical condition or other [facility] requiring further screening, evaluation, or treatment in the ED at this time prior to discharge.  The patient is safe for discharge with strict return precautions.       Final Clinical Impression(s) / ED Diagnoses Final diagnoses:  Accidental overdose of heroin, initial encounter (HCC)  Polysubstance abuse (HCC)     The patient appears reasonably screened and/or stabilized for discharge and I doubt any other medical condition or other [facility] requiring further screening, evaluation, or treatment in  the ED at this time prior to discharge.  Disposition: Discharge  Condition: Good  I have discussed the results, Dx and Tx plan with the patient who expressed understanding and agree(s) with the plan. Discharge instructions discussed at great length. The patient was given strict return precautions who verbalized understanding of the instructions. No further questions at time of discharge.    ED Discharge Orders    None       Follow Up: Primary care provider  Schedule an appointment as soon as possible for a visit  If you do not have a primary care physician, contact [facility] at [phone] for referral     This chart was dictated using voice recognition software.  Despite best efforts to proofread,  errors can occur which can change the documentation meaning.   Nira Conn, MD 05/13/19 [phone]

## 2019-05-12 NOTE — ED Triage Notes (Signed)
Pt BIB [facility] from home after pt reports taking 4 Xanax and using heroin for the first time. When EMS arrived, pt RR was 4, family performing CPR. Pt never loss pulses, Narcan was administered and pt became alert. Pt denies SI and states he tried heroin to help with his chronic back pain and "pinched nerve problem."

## 2019-05-13 LAB — COMPREHENSIVE METABOLIC PANEL
ALT: 53 U/L — ABNORMAL HIGH (ref 0–44)
AST: 27 U/L (ref 15–41)
Albumin: 4.6 g/dL (ref 3.5–5.0)
Alkaline Phosphatase: 52 U/L (ref 38–126)
Anion gap: 8 (ref 5–15)
BUN: 10 mg/dL (ref 6–20)
CO2: 27 mmol/L (ref 22–32)
Calcium: 9.4 mg/dL (ref 8.9–10.3)
Chloride: 102 mmol/L (ref 98–111)
Creatinine, Ser: 0.83 mg/dL (ref 0.61–1.24)
GFR calc Af Amer: >60 mL/min (ref >60)
GFR calc non Af Amer: >60 mL/min (ref >60)
Glucose, Bld: 138 mg/dL — ABNORMAL HIGH (ref 70–99)
Potassium: 3.8 mmol/L (ref 3.5–5.1)
Sodium: 137 mmol/L (ref 135–145)
Total Bilirubin: 0.9 mg/dL (ref 0.3–1.2)
Total Protein: 7.3 g/dL (ref 6.5–8.1)

## 2019-05-13 LAB — CBC WITH DIFFERENTIAL/PLATELET
Abs Immature Granulocytes: 0.05 10*3/uL (ref 0.00–0.07)
Basophils Absolute: 0.1 10*3/uL (ref 0.0–0.1)
Basophils Relative: 0 %
Eosinophils Absolute: 0.1 10*3/uL (ref 0.0–0.5)
Eosinophils Relative: 1 %
HCT: 44 % (ref 39.0–52.0)
Hemoglobin: 14.4 g/dL (ref 13.0–17.0)
Immature Granulocytes: 0 %
Lymphocytes Relative: 16 %
Lymphs Abs: 2 10*3/uL (ref 0.7–4.0)
MCH: 28.2 pg (ref 26.0–34.0)
MCHC: 32.7 g/dL (ref 30.0–36.0)
MCV: 86.3 fL (ref 80.0–100.0)
Monocytes Absolute: 0.6 10*3/uL (ref 0.1–1.0)
Monocytes Relative: 5 %
Neutro Abs: 9.5 10*3/uL — ABNORMAL HIGH (ref 1.7–7.7)
Neutrophils Relative %: 78 %
Platelets: 274 10*3/uL (ref 150–400)
RBC: 5.1 MIL/uL (ref 4.22–5.81)
RDW: 13.2 % (ref 11.5–15.5)
WBC: 12.3 10*3/uL — ABNORMAL HIGH (ref 4.0–10.5)
nRBC: 0 % (ref 0.0–0.2)

## 2019-05-13 LAB — SALICYLATE LEVEL: Salicylate Lvl: <7 mg/dL — ABNORMAL LOW (ref 7.0–30.0)

## 2019-05-13 LAB — ACETAMINOPHEN LEVEL: Acetaminophen (Tylenol), Serum: <10 ug/mL — ABNORMAL LOW (ref 10–30)

## 2019-05-13 LAB — RAPID URINE DRUG SCREEN, HOSP PERFORMED
Amphetamines: NOT DETECTED
Barbiturates: NOT DETECTED
Benzodiazepines: POSITIVE — AB
Cocaine: NOT DETECTED
Opiates: NOT DETECTED
Tetrahydrocannabinol: POSITIVE — AB

## 2019-05-13 LAB — ETHANOL: Alcohol, Ethyl (B): <10 mg/dL (ref <10)

## 2019-05-17 ENCOUNTER — Encounter (HOSPITAL_COMMUNITY): Payer: Self-pay

## 2019-05-17 ENCOUNTER — Emergency Department (HOSPITAL_COMMUNITY)
Admission: EM | Admit: 2019-05-17 | Discharge: 2019-05-17 | Disposition: A | Discharge status: Home / Self Care | Payer: Self-pay | Attending: Emergency Medicine | Admitting: Emergency Medicine

## 2019-05-17 ENCOUNTER — Other Ambulatory Visit: Payer: Self-pay

## 2019-05-17 DIAGNOSIS — M549 Dorsalgia, unspecified: Secondary | ICD-10-CM | POA: Insufficient documentation

## 2019-05-17 DIAGNOSIS — Z5321 Procedure and treatment not carried out due to patient leaving prior to being seen by health care provider: Secondary | ICD-10-CM | POA: Insufficient documentation

## 2019-05-17 NOTE — ED Triage Notes (Signed)
Pt reports he has a pinched nerve in left thigh that causes pain to lower back. Pt reports he was at chiropractor today and they told him to come here to get steroids.

## 2019-05-17 NOTE — ED Notes (Signed)
Patient stated he needed to leave because his cell phone was about to die and his ride was about to leave. Informed Dr. Adela Lank.

## 2019-05-18 ENCOUNTER — Other Ambulatory Visit: Payer: Self-pay

## 2019-05-18 ENCOUNTER — Emergency Department (HOSPITAL_COMMUNITY)
Admission: EM | Admit: 2019-05-18 | Discharge: 2019-05-18 | Disposition: A | Discharge status: Home / Self Care | Payer: Self-pay | Attending: Emergency Medicine | Admitting: Emergency Medicine

## 2019-05-18 ENCOUNTER — Encounter (HOSPITAL_COMMUNITY): Payer: Self-pay

## 2019-05-18 DIAGNOSIS — M5432 Sciatica, left side: Secondary | ICD-10-CM | POA: Insufficient documentation

## 2019-05-18 MED ORDER — NAPROXEN 500 MG PO TABS: 500.0000 mg | ORAL_TABLET | Freq: Two times a day (BID) | ORAL | 0 refills | Status: AC

## 2019-05-18 MED ORDER — PREDNISONE 20 MG PO TABS: 40.0000 mg | ORAL_TABLET | Freq: Every day | ORAL | 0 refills | Status: AC

## 2019-05-18 NOTE — ED Provider Notes (Signed)
[facility] DEPT Provider Note   CSN: [id number] Arrival date & time: 05/18/19  1313     History Chief Complaint  Patient presents with  . Back Pain  . Leg Pain    [facility] is a 24 y.o. male.  The history is provided by the patient and medical records. No language interpreter was used.  Back Pain Associated symptoms: leg pain   Associated symptoms: no fever and no numbness   Leg Pain Associated symptoms: back pain   Associated symptoms: no fever      24 year old male with history of recurrent back pain, heroin abuse, presenting complaining of back pain.  Patient report he has chronic low back pain ongoing for the past 5 years.  Within the past month he noticed increasing pain to his low back radiates down his left leg.  Pain sometimes radiates to his knee and at times radiates to his ankle.  Pain worsening with certain movement.  Pain is described as sharp and shooting moderate to severe.  Sometime he endorsed some tingling sensation to his toes.  He does not complain of any associated fever or chills no bowel bladder incontinence or saddle anesthesia.  Denies any history of IV drug use, only snorting heroin in the past.  No report of history of active cancer.  Patient states he does follow with a chiropractor for the past month with some help however today his [place]rock to recommend patient come to the ER and request for steroids added may aid with his pain.  He admits to smoking weed on a regular basis.  Patient adamantly denies ever using IV drugs.  He works in [employer] and does do heavy lifting regularly.  He mention he has had x-rays of his back by his chiropractor already.    History reviewed. No pertinent past medical history.  There are no problems to display for this patient.   History reviewed. No pertinent surgical history.     Family History  Family history unknown: Yes    Social History   Tobacco Use  . Smoking  status: Never Smoker  . Smokeless tobacco: Never Used  Substance Use Topics  . Alcohol use: Yes    Comment: Last drink: "up to around 22:00".   . Drug use: Yes    Types: Marijuana    Comment: Last used: today     Home Medications Prior to Admission medications   Medication Sig Start Date End Date Taking? Authorizing Provider  azithromycin (ZITHROMAX) 250 MG tablet Take 1 tablet (250 mg total) by mouth daily. Take first 2 tablets together, then 1 every day until finished. Patient not taking: Reported on 01/27/2019 12/30/12   Delos Haring, PA-C  cephALEXin (KEFLEX) 500 MG capsule Take 1 capsule (500 mg total) by mouth 3 (three) times daily. Patient not taking: Reported on 01/27/2019 12/22/15   Sam, Olivia Canter, PA-C  guaiFENesin-codeine 100-10 MG/5ML syrup Take 5 mLs by mouth at bedtime as needed for cough. Patient not taking: Reported on 01/27/2019 12/30/12   Delos Haring, PA-C  naproxen (NAPROSYN) 500 MG tablet Take 500 mg by mouth 2 (two) times daily with a meal.    [provider]  predniSONE (DELTASONE) 20 MG tablet Take 2 tablets (40 mg total) by mouth daily. Patient not taking: Reported on 01/27/2019 12/22/15   Anne Ng, PA-C    Allergies    Patient has no known allergies.  Review of Systems   Review of Systems  Constitutional: Negative  for fever.  Musculoskeletal: Positive for back pain.  Skin: Negative for wound.  Neurological: Negative for numbness.    Physical Exam Updated Vital Signs BP 120/74 (BP Location: Left Arm)   Pulse 69   Temp 98 F (36.7 C) (Oral)   Resp 16   Ht 5\' 9"  (1.753 m)   Wt 100.8 kg   SpO2 97%   BMI 32.82 kg/m   Physical Exam Vitals and nursing note reviewed.  Constitutional:      General: He is not in acute distress.    Appearance: He is well-developed.     Comments: Patient resting comfortably in the chair in no acute discomfort, playing on his phone.  HENT:     Head: Atraumatic.  Eyes:     Conjunctiva/sclera:  Conjunctivae normal.  Abdominal:     Palpations: Abdomen is soft.     Tenderness: There is no abdominal tenderness.  Musculoskeletal:        General: Tenderness (Minimal tenderness to lumbar para lumbar spinal muscles on exam.  Full range of motion throughout the back, normal gait.) present.     Cervical back: Neck supple.     Comments: 5/5 strength to BLE.  Negative SLR bilaterally.  Skin:    Capillary Refill: Capillary refill takes less than 2 seconds.     Findings: No rash.  Neurological:     Mental Status: He is alert.     Comments: Patella DTR intact bilaterally     ED Results / Procedures / Treatments   Labs (all labs ordered are listed, but only abnormal results are displayed) Labs Reviewed - No data to display  EKG None  Radiology No results found.   Procedures Procedures (including critical care time)  Medications Ordered in ED Medications - No data to display  ED Course  I have reviewed the triage vital signs and the nursing notes.  Pertinent labs & imaging results that were available during my care of the patient were reviewed by me and considered in my medical decision making (see chart for details).    MDM Rules/Calculators/A&P                      BP 120/74 (BP Location: Left Arm)   Pulse 69   Temp 98 F (36.7 C) (Oral)   Resp 16   Ht 5\' 9"  (1.753 m)   Wt 100.8 kg   SpO2 97%   BMI 32.82 kg/m   Final Clinical Impression(s) / ED Diagnoses Final diagnoses:  Sciatica of left side    Rx / DC Orders ED Discharge Orders    None     3:11 PM Patient here with recurrent radicular low back pain down to his left leg suggestive of sciatica.  Recently patient had a heroin overdose which puts him at high risk of drug abuse.  Patient however denies IV drug use therefore I have low suspicions for spinal infection causing his chronic back pain.  No red flags.  Will prescribe steroid as well as anti-inflammatory medication for his back pain.  Patient will  continue to follow-up with his chiropractor for further care.  Return precautions discussed.  Encourage patient to avoid heroin.   , PA-C 05/18/19 1515    Fayrene Helper, MD 05/18/19 [phone]

## 2019-05-18 NOTE — ED Triage Notes (Signed)
Patient states he began having left lower back pain that radiates down his left leg x 1 week.

## 2019-05-21 NOTE — ED Provider Notes (Signed)
Patient left without being seen  1. Patient left without being seen       Melene Plan, DO 05/21/19 [phone]

## 2020-02-06 ENCOUNTER — Encounter (HOSPITAL_COMMUNITY): Payer: Self-pay

## 2020-02-06 ENCOUNTER — Other Ambulatory Visit: Payer: Self-pay

## 2020-02-06 ENCOUNTER — Emergency Department (HOSPITAL_COMMUNITY)
Admission: EM | Admit: 2020-02-06 | Discharge: 2020-02-06 | Disposition: A | Discharge status: Home / Self Care | Payer: Self-pay | Attending: Emergency Medicine | Admitting: Emergency Medicine

## 2020-02-06 DIAGNOSIS — R519 Headache, unspecified: Secondary | ICD-10-CM | POA: Insufficient documentation

## 2020-02-06 DIAGNOSIS — R0989 Other specified symptoms and signs involving the circulatory and respiratory systems: Secondary | ICD-10-CM | POA: Insufficient documentation

## 2020-02-06 DIAGNOSIS — R432 Parageusia: Secondary | ICD-10-CM | POA: Insufficient documentation

## 2020-02-06 DIAGNOSIS — Z5321 Procedure and treatment not carried out due to patient leaving prior to being seen by health care provider: Secondary | ICD-10-CM | POA: Insufficient documentation

## 2020-02-06 NOTE — ED Notes (Signed)
Pt called for a room x 3, no answer.

## 2020-02-06 NOTE — ED Notes (Signed)
Pt called for a room x 2, no answer. 

## 2020-02-06 NOTE — ED Triage Notes (Signed)
Patient c/o headache. Chest congestion and no taste that started today.

## 2020-02-06 NOTE — ED Notes (Signed)
Pt called for a room, no answer ?

## 2020-04-25 ENCOUNTER — Emergency Department (HOSPITAL_COMMUNITY)
Admission: EM | Admit: 2020-04-25 | Discharge: 2020-04-25 | Disposition: A | Discharge status: Home / Self Care | Payer: Self-pay | Attending: Emergency Medicine | Admitting: Emergency Medicine

## 2020-04-25 ENCOUNTER — Other Ambulatory Visit: Payer: Self-pay

## 2020-04-25 ENCOUNTER — Encounter (HOSPITAL_COMMUNITY): Payer: Self-pay | Admitting: Emergency Medicine

## 2020-04-25 DIAGNOSIS — N50811 Right testicular pain: Secondary | ICD-10-CM | POA: Insufficient documentation

## 2020-04-25 DIAGNOSIS — Z7253 High risk bisexual behavior: Secondary | ICD-10-CM | POA: Insufficient documentation

## 2020-04-25 DIAGNOSIS — R300 Dysuria: Secondary | ICD-10-CM | POA: Insufficient documentation

## 2020-04-25 DIAGNOSIS — Z7251 High risk heterosexual behavior: Secondary | ICD-10-CM

## 2020-04-25 LAB — URINALYSIS, ROUTINE W REFLEX MICROSCOPIC
Bilirubin Urine: NEGATIVE
Glucose, UA: NEGATIVE mg/dL
Hgb urine dipstick: NEGATIVE
Ketones, ur: NEGATIVE mg/dL
Leukocytes,Ua: NEGATIVE
Nitrite: NEGATIVE
Protein, ur: NEGATIVE mg/dL
Specific Gravity, Urine: 1.023 (ref 1.005–1.030)
pH: 5 (ref 5.0–8.0)

## 2020-04-25 LAB — HIV ANTIBODY (ROUTINE TESTING W REFLEX): HIV Screen 4th Generation wRfx: NONREACTIVE

## 2020-04-25 MED ORDER — DOXYCYCLINE HYCLATE 100 MG PO TABS
100.0000 mg | ORAL_TABLET | Freq: Once | ORAL | Status: AC
  Administered 2020-04-25: 100 mg via ORAL
  Filled 2020-04-25: qty 1

## 2020-04-25 MED ORDER — DOXYCYCLINE HYCLATE 100 MG PO CAPS: 100.0000 mg | ORAL_CAPSULE | Freq: Two times a day (BID) | ORAL | 0 refills | Status: AC

## 2020-04-25 MED ORDER — CEFTRIAXONE SODIUM 1 G IJ SOLR
500.0000 mg | Freq: Once | INTRAMUSCULAR | Status: AC
  Administered 2020-04-25: 500 mg via INTRAMUSCULAR
  Filled 2020-04-25: qty 10

## 2020-04-25 MED ORDER — STERILE WATER FOR INJECTION IJ SOLN
INTRAMUSCULAR | Status: AC
  Filled 2020-04-25: qty 10

## 2020-04-25 NOTE — Discharge Instructions (Signed)
Please read the instructions below.  Talk with your primary care provider about any new medications.  Take the antibiotic, doxycycline, every 12 hours until gone.  Protect your skin from the sun while taking this medication as it can increase your sensitivity to UV rays. Please follow-up with urology for your recurrent right testicular pain. You will receive a call from the hospital if your test results come back positive. Avoid sexual activity until you know your test results. If your results come back positive, it is important that you inform all of your sexual partners.  Return to the ER for fever, worsening pain or swelling, abdominal pain, pain with bowel movement, or new or concerning symptoms.

## 2020-04-25 NOTE — ED Triage Notes (Signed)
Patient had a new sexual partner a few nights ago, today when he was urinating and he started to have some pain, denies any open sores or discharge but states his urine looks more yellow.

## 2020-04-25 NOTE — ED Provider Notes (Signed)
[facility]-EMERGENCY DEPT Provider Note   CSN: [id number] Arrival date & time: 04/25/20  1518     History No chief complaint on file.   [facility] is a 24 y.o. male presenting to the ED with complaint of right testicular pain that began on Monday. Patient states he has had unprotected intercourse a few days ago prior to symptom onset. He has some intermittent mild right testicle pain and dysuria. Denies scrotal swelling, fever, abd pain, pain with defecation, vomiting, penile discharge. He is sexually active with male partners without protection. States he had epididymitis in the past, feels very similar.   The history is provided by the patient.       History reviewed. No pertinent past medical history.  There are no problems to display for this patient.   History reviewed. No pertinent surgical history.     Family History  Family history unknown: Yes    Social History   Tobacco Use  . Smoking status: Never Smoker  . Smokeless tobacco: Never Used  Vaping Use  . Vaping Use: Never used  Substance Use Topics  . Alcohol use: Yes  . Drug use: Yes    Types: Marijuana    Home Medications Prior to Admission medications   Medication Sig Start Date End Date Taking? Authorizing Provider  doxycycline (VIBRAMYCIN) 100 MG capsule Take 1 capsule (100 mg total) by mouth 2 (two) times daily. 04/25/20   David Rodriquez, [place], PA-C  naproxen (NAPROSYN) 500 MG tablet Take 1 tablet (500 mg total) by mouth 2 (two) times daily with a meal. 05/18/19   Fayrene Helper, PA-C  predniSONE (DELTASONE) 20 MG tablet Take 2 tablets (40 mg total) by mouth daily. 05/18/19   Fayrene Helper, PA-C    Allergies    Patient has no known allergies.  Review of Systems   Review of Systems  All other systems reviewed and are negative.   Physical Exam Updated Vital Signs BP 140/66 (BP Location: Left Arm)   Pulse 83   Temp 99 F (37.2 C) (Oral)   Resp 18   Ht 5\' 10"   (1.778 m)   Wt 99.8 kg   SpO2 98%   BMI 31.57 kg/m   Physical Exam Vitals and nursing note reviewed.  Constitutional:      General: He is not in acute distress.    Appearance: He is well-developed.  HENT:     Head: Normocephalic and atraumatic.  Eyes:     Conjunctiva/sclera: Conjunctivae normal.  Cardiovascular:     Rate and Rhythm: Normal rate.  Pulmonary:     Effort: Pulmonary effort is normal.  Genitourinary:    Penis: Uncircumcised. No tenderness.      Comments: Exam performed with NT chaperone present.  There is clear discharge noted at the urethral meatus.  No rashes or lesions.  Right epididymis with mild swelling and tenderness noted, no deformity.  Cremasteric reflex intact.  Scrotal swelling.  Left testicle, epididymis, scrotum are nontender and normal-appearing.  No palpable inguinal lymphadenopathy. Neurological:     Mental Status: He is alert.  Psychiatric:        Mood and Affect: Mood normal.        Behavior: Behavior normal.     ED Results / Procedures / Treatments   Labs (all labs ordered are listed, but only abnormal results are displayed) Labs Reviewed  URINALYSIS, ROUTINE W REFLEX MICROSCOPIC  HIV ANTIBODY (ROUTINE TESTING W REFLEX)  RPR  GC/CHLAMYDIA PROBE AMP ([facility]) NOT AT [facility]    EKG None  Radiology No results found.  Procedures Procedures (including critical care time)  Medications Ordered in ED Medications  cefTRIAXone (ROCEPHIN) injection 500 mg (has no administration in time range)  doxycycline (VIBRA-TABS) tablet 100 mg (has no administration in time range)    ED Course  I have reviewed the triage vital signs and the nursing notes.  Pertinent labs & imaging results that were available during my care of the patient were reviewed by me and considered in my medical decision making (see chart for details).    MDM Rules/Calculators/A&P                          Patient presenting for intermittent right testicular pain, and  dysuria began on Monday, after having unprotected sexual intercourse with a male partner.  Reports history of epididymitis in the past, this feels very similar.  He has no systemic symptoms or pain with bowel movement to suggest prostatitis.  Examination reveals some clear discharge at the urethral meatus, tenderness and mild swelling to the right epididymis.  No Bell Clapper deformity is noted, cremasteric reflex is intact.  Had shared decision making with patient, he states this feels very similar to prior episode of epididymitis.  Pain is mild, he has no deformity noted, unlikely torsion.  Patient feels comfortable deferring ultrasound at this time, will treat with antibiotics for epididymitis.  GC/Chlamydia culture sent, HIV and RPR also ordered.  He was treated here with Rocephin and doxycycline, will be prescribed doxycycline, instructed to avoid intercourse and after inform all sexual partners of any positive results.  He is also provided with urology referral and strict return precautions.  Discussed results, findings, treatment and follow up. Patient advised of return precautions. Patient verbalized understanding and agreed with plan.  Final Clinical Impression(s) / ED Diagnoses Final diagnoses:  Pain in right testicle  High risk sexual behavior, unspecified type    Rx / DC Orders ED Discharge Orders         Ordered    doxycycline (VIBRAMYCIN) 100 MG capsule  2 times daily        04/25/20 1710           Teena Mangus, [place], [place] 04/25/20 1710    Rozelle Logan, DO 04/25/20 1734

## 2020-04-25 NOTE — ED Notes (Signed)
An After Visit Summary was printed and given to the patient. Discharge instructions given and no further questions at this time.  

## 2020-04-26 LAB — RPR: RPR Ser Ql: NONREACTIVE

## 2020-04-26 NOTE — ED Notes (Signed)
Opened chart at pts request to explain he has to take prescription to pharmacy to have it filled.

## 2020-04-30 LAB — GC/CHLAMYDIA PROBE AMP (~~LOC~~) NOT AT ARMC
Chlamydia: NEGATIVE
Comment: NEGATIVE
Comment: NORMAL
Neisseria Gonorrhea: NEGATIVE

## 2020-09-27 IMAGING — US US SCROTUM W/ DOPPLER COMPLETE
1 series · 14 of 25 positions shown · non-contrast
Comparison: Scrotal ultrasound dated 02/22/2019

CLINICAL DATA: 23-year-old male with testicular pain x1 week.

EXAM:
SCROTAL ULTRASOUND
DOPPLER ULTRASOUND OF THE TESTICLES
TECHNIQUE: Complete ultrasound examination of the testicles, epididymis, and
other scrotal structures was performed. Color and spectral Doppler
ultrasound were also utilized to evaluate blood flow to the
testicles.

[Series 1: us scrotum w/ doppler complete · 14 of 47 slices shown]
[im 1/47]
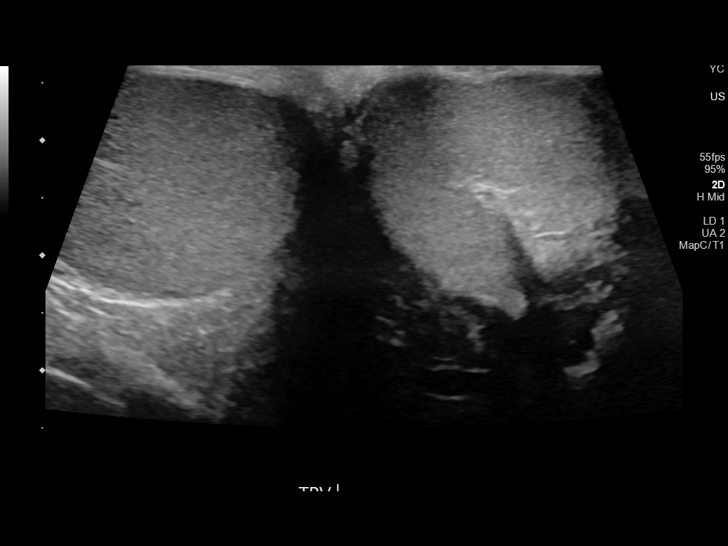
[im 4/47]
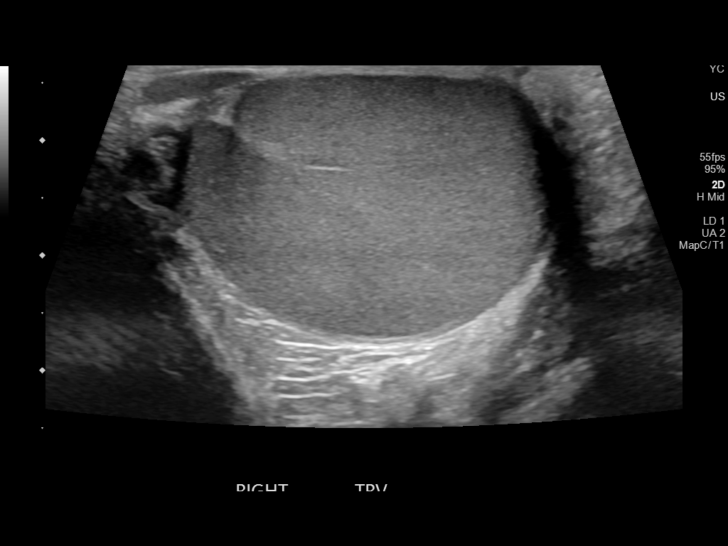
[im 8/47]
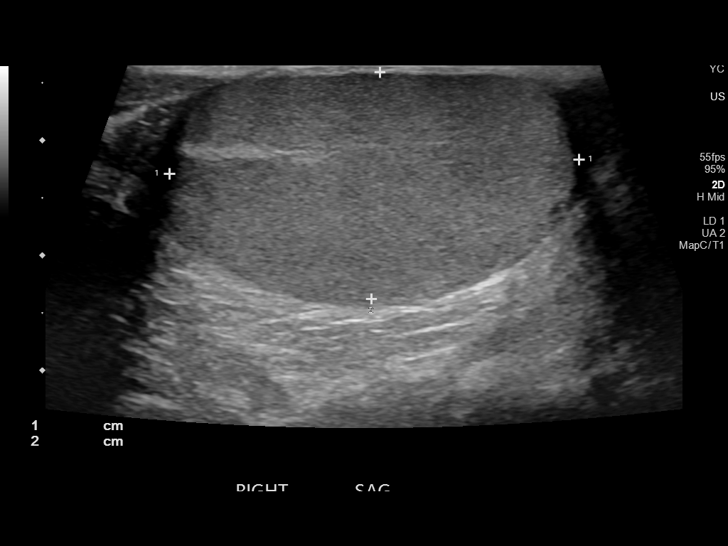
[im 12/47]
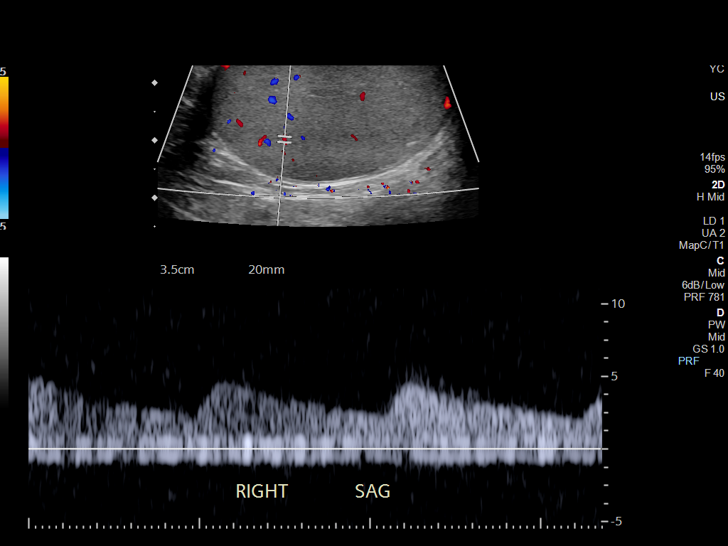
[im 16/47]
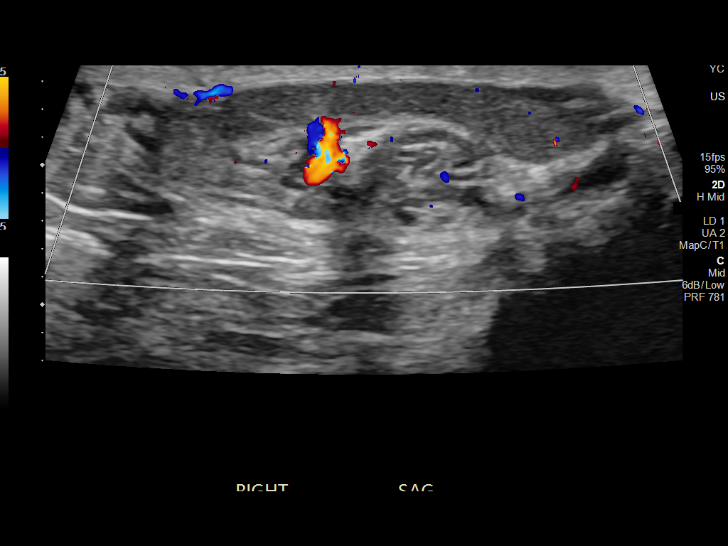
[im 18/47]
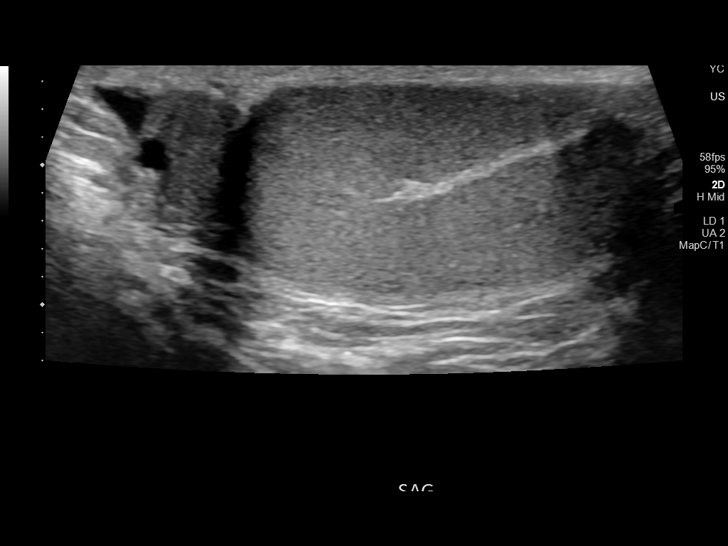
[im 22/47]
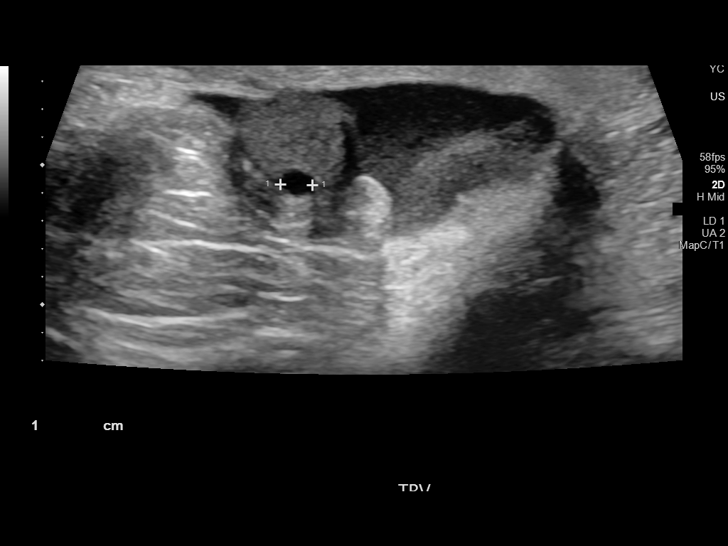
[im 25/47]
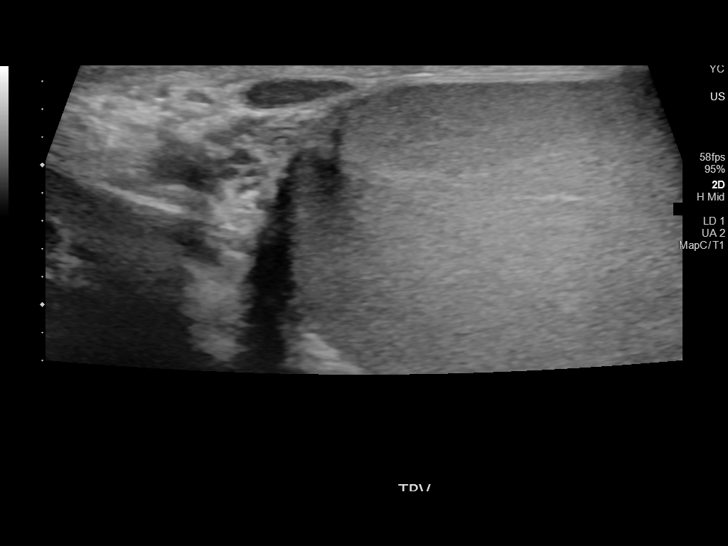
[im 29/47]
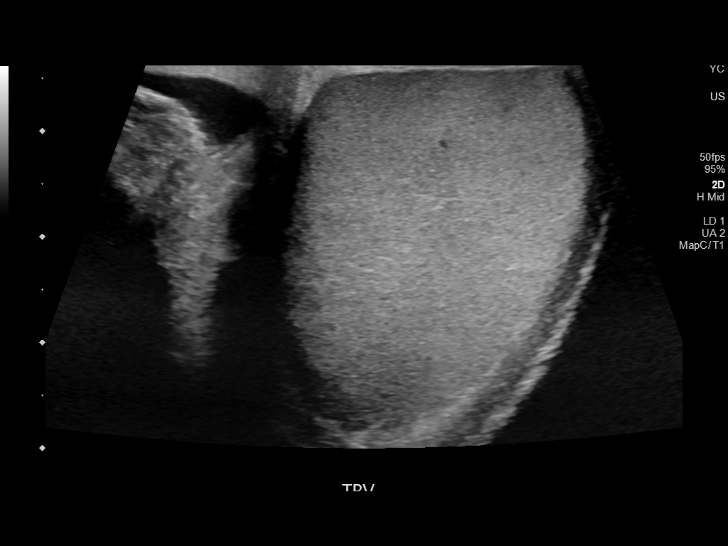
[im 31/47]
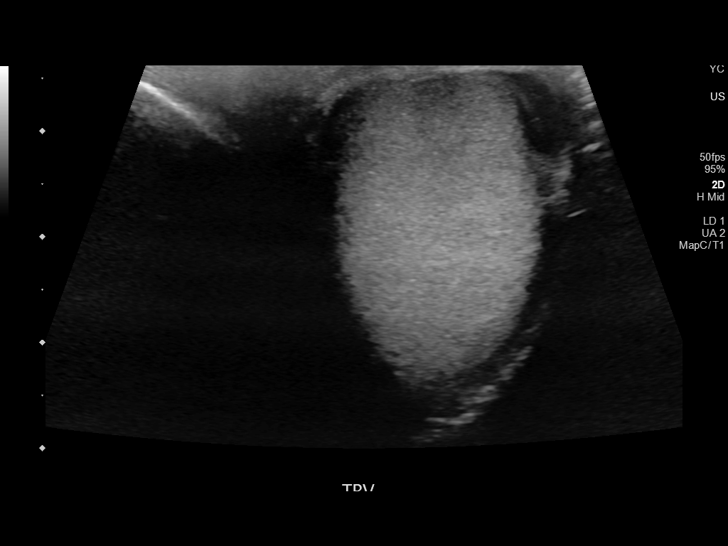
[im 35/47]
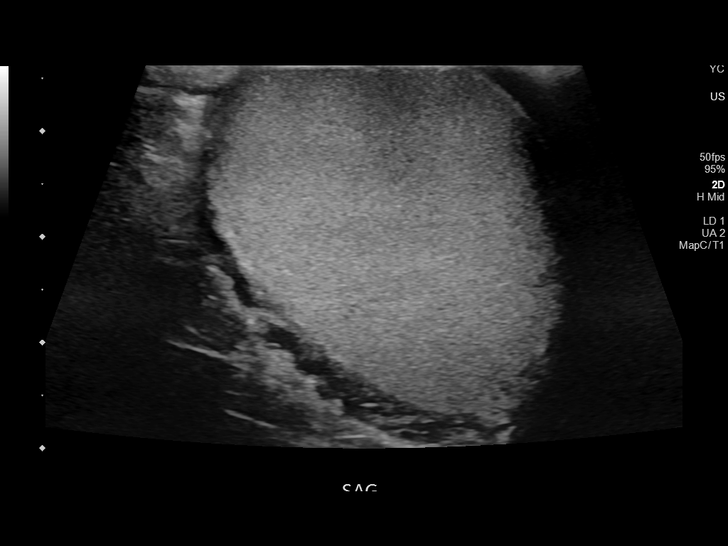
[im 39/47]
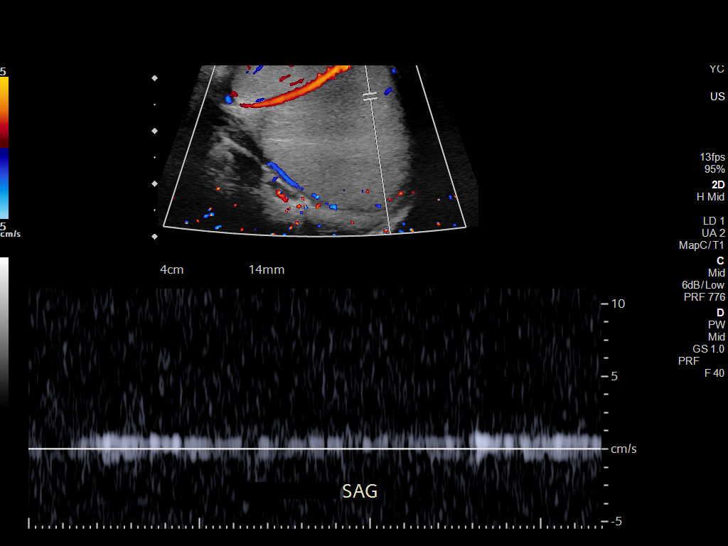
[im 43/47]
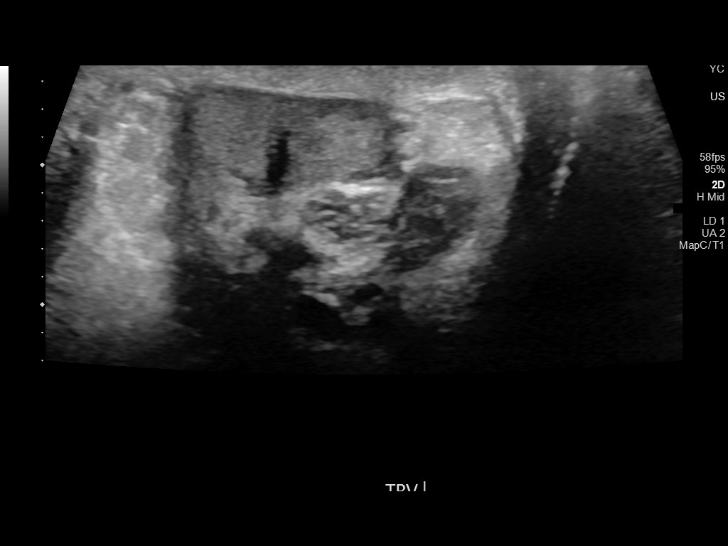
[im 47/47]
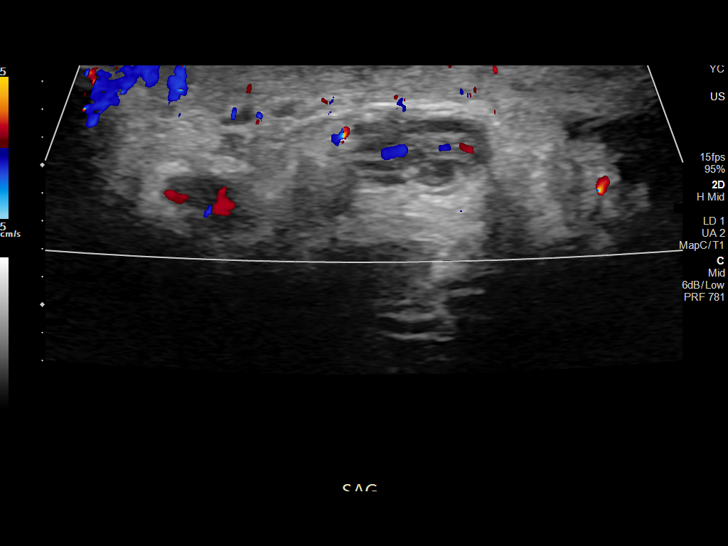

[14 of 25 positions shown; findings below may reference images not displayed]

FINDINGS: Right testicle

Measurements: 3.6 x 2.0 x 3.1 cm. No mass or microlithiasis
visualized.

Left testicle

Measurements: 3.9 x 2.9 x 2.7 cm. No mass or microlithiasis
visualized.

Right epididymis: Normal in size and appearance. A 3 mm right
epididymal head cyst noted.

Left epididymis:  Normal in size and appearance.

Hydrocele:  None visualized.

Varicocele:  None visualized.

Pulsed Doppler interrogation of both testes demonstrates normal low
resistance arterial and venous waveforms bilaterally.
IMPRESSION: 1. Unremarkable testicles with Doppler detected arterial and venous
flow bilaterally.
2. A 3 mm right epididymal head cyst.

## 2022-02-09 ENCOUNTER — Inpatient Hospital Stay (HOSPITAL_COMMUNITY): Payer: Medicaid Other

## 2022-02-09 ENCOUNTER — Emergency Department (HOSPITAL_COMMUNITY): Payer: Medicaid Other

## 2022-02-09 ENCOUNTER — Emergency Department (HOSPITAL_COMMUNITY): Payer: Medicaid Other | Admitting: Certified Registered Nurse Anesthetist

## 2022-02-09 ENCOUNTER — Encounter (HOSPITAL_COMMUNITY): Admission: EM | Disposition: E | Payer: Self-pay | Source: Home / Self Care

## 2022-02-09 ENCOUNTER — Inpatient Hospital Stay (HOSPITAL_COMMUNITY)
Admission: EM | Admit: 2022-02-09 | Discharge: 2022-03-05 | DRG: 957 | Disposition: E | Payer: Medicaid Other | Attending: Surgery | Admitting: Surgery

## 2022-02-09 ENCOUNTER — Other Ambulatory Visit: Payer: Self-pay

## 2022-02-09 DIAGNOSIS — T07XXXA Unspecified multiple injuries, initial encounter: Principal | ICD-10-CM

## 2022-02-09 DIAGNOSIS — Z9889 Other specified postprocedural states: Secondary | ICD-10-CM

## 2022-02-09 DIAGNOSIS — T1490XA Injury, unspecified, initial encounter: Secondary | ICD-10-CM | POA: Diagnosis present

## 2022-02-09 DIAGNOSIS — D649 Anemia, unspecified: Secondary | ICD-10-CM | POA: Diagnosis not present

## 2022-02-09 DIAGNOSIS — S27322A Contusion of lung, bilateral, initial encounter: Secondary | ICD-10-CM | POA: Diagnosis present

## 2022-02-09 DIAGNOSIS — S36114A Minor laceration of liver, initial encounter: Secondary | ICD-10-CM | POA: Diagnosis present

## 2022-02-09 DIAGNOSIS — R000 Tachycardia, unspecified: Secondary | ICD-10-CM | POA: Diagnosis present

## 2022-02-09 DIAGNOSIS — Z515 Encounter for palliative care: Secondary | ICD-10-CM

## 2022-02-09 DIAGNOSIS — S27899A Unspecified injury of other specified intrathoracic organs, initial encounter: Secondary | ICD-10-CM | POA: Diagnosis present

## 2022-02-09 DIAGNOSIS — W3400XA Accidental discharge from unspecified firearms or gun, initial encounter: Secondary | ICD-10-CM | POA: Diagnosis not present

## 2022-02-09 DIAGNOSIS — S061XAA Traumatic cerebral edema with loss of consciousness status unknown, initial encounter: Principal | ICD-10-CM | POA: Diagnosis present

## 2022-02-09 DIAGNOSIS — Y906 Blood alcohol level of 120-199 mg/100 ml: Secondary | ICD-10-CM | POA: Diagnosis present

## 2022-02-09 DIAGNOSIS — Z66 Do not resuscitate: Secondary | ICD-10-CM | POA: Diagnosis present

## 2022-02-09 DIAGNOSIS — I959 Hypotension, unspecified: Secondary | ICD-10-CM | POA: Diagnosis present

## 2022-02-09 DIAGNOSIS — S36499A Other injury of unspecified part of small intestine, initial encounter: Secondary | ICD-10-CM | POA: Diagnosis not present

## 2022-02-09 DIAGNOSIS — S06349A Traumatic hemorrhage of right cerebrum with loss of consciousness of unspecified duration, initial encounter: Secondary | ICD-10-CM | POA: Diagnosis present

## 2022-02-09 DIAGNOSIS — S36893A Laceration of other intra-abdominal organs, initial encounter: Secondary | ICD-10-CM | POA: Diagnosis present

## 2022-02-09 DIAGNOSIS — J942 Hemothorax: Secondary | ICD-10-CM | POA: Diagnosis present

## 2022-02-09 DIAGNOSIS — E876 Hypokalemia: Secondary | ICD-10-CM

## 2022-02-09 DIAGNOSIS — J810 Acute pulmonary edema: Secondary | ICD-10-CM | POA: Diagnosis present

## 2022-02-09 DIAGNOSIS — S0240EA Zygomatic fracture, right side, initial encounter for closed fracture: Secondary | ICD-10-CM | POA: Diagnosis present

## 2022-02-09 HISTORY — PX: LAPAROTOMY: SHX154

## 2022-02-09 HISTORY — PX: APPLICATION OF WOUND VAC: SHX5189

## 2022-02-09 HISTORY — PX: CHEST TUBE INSERTION: SHX231

## 2022-02-09 LAB — POCT I-STAT 7, (LYTES, BLD GAS, ICA,H+H)
Acid-base deficit: 14 mmol/L — ABNORMAL HIGH (ref 0.0–2.0)
Acid-base deficit: 13 mmol/L — ABNORMAL HIGH (ref 0.0–2.0)
Bicarbonate: 19.1 mmol/L — ABNORMAL LOW (ref 20.0–28.0)
Bicarbonate: 22 mmol/L (ref 20.0–28.0)
Calcium, Ion: 0.73 mmol/L — CL (ref 1.15–1.40)
Calcium, Ion: 0.99 mmol/L — ABNORMAL LOW (ref 1.15–1.40)
HCT: 50 % (ref 39.0–52.0)
HCT: 53 % — ABNORMAL HIGH (ref 39.0–52.0)
Hemoglobin: 17 g/dL (ref 13.0–17.0)
Hemoglobin: 18 g/dL — ABNORMAL HIGH (ref 13.0–17.0)
O2 Saturation: 82 %
O2 Saturation: 77 %
Potassium: 4.4 mmol/L (ref 3.5–5.1)
Potassium: 4 mmol/L (ref 3.5–5.1)
Sodium: 148 mmol/L — ABNORMAL HIGH (ref 135–145)
Sodium: 149 mmol/L — ABNORMAL HIGH (ref 135–145)
TCO2: 21 mmol/L — ABNORMAL LOW (ref 22–32)
TCO2: 25 mmol/L (ref 22–32)
pCO2 arterial: 74.9 mmHg (ref 32–48)
pCO2 arterial: 93.9 mmHg (ref 32–48)
pH, Arterial: 7.014 — CL (ref 7.35–7.45)
pH, Arterial: 6.978 — CL (ref 7.35–7.45)
pO2, Arterial: 69 mmHg — ABNORMAL LOW (ref 83–108)
pO2, Arterial: 66 mmHg — ABNORMAL LOW (ref 83–108)

## 2022-02-09 LAB — CBC WITH DIFFERENTIAL/PLATELET
Abs Immature Granulocytes: 0.02 10*3/uL (ref 0.00–0.07)
Basophils Absolute: 0 10*3/uL (ref 0.0–0.1)
Basophils Relative: 0 %
Eosinophils Absolute: 0 10*3/uL (ref 0.0–0.5)
Eosinophils Relative: 0 %
HCT: 58.1 % — ABNORMAL HIGH (ref 39.0–52.0)
Hemoglobin: 19.9 g/dL — ABNORMAL HIGH (ref 13.0–17.0)
Immature Granulocytes: 0 %
Lymphocytes Relative: 16 %
Lymphs Abs: 0.8 10*3/uL (ref 0.7–4.0)
MCH: 30 pg (ref 26.0–34.0)
MCHC: 34.3 g/dL (ref 30.0–36.0)
MCV: 87.6 fL (ref 80.0–100.0)
Monocytes Absolute: 0.3 10*3/uL (ref 0.1–1.0)
Monocytes Relative: 6 %
Neutro Abs: 3.7 10*3/uL (ref 1.7–7.7)
Neutrophils Relative %: 78 %
Platelets: 86 10*3/uL — ABNORMAL LOW (ref 150–400)
RBC: 6.63 MIL/uL — ABNORMAL HIGH (ref 4.22–5.81)
RDW: 15.2 % (ref 11.5–15.5)
WBC: 4.9 10*3/uL (ref 4.0–10.5)
nRBC: 0 % (ref 0.0–0.2)

## 2022-02-09 LAB — COMPREHENSIVE METABOLIC PANEL
ALT: 15 U/L (ref 0–44)
AST: 38 U/L (ref 15–41)
Albumin: 4.7 g/dL (ref 3.5–5.0)
Alkaline Phosphatase: 61 U/L (ref 38–126)
Anion gap: 19 — ABNORMAL HIGH (ref 5–15)
BUN: 8 mg/dL (ref 6–20)
CO2: 14 mmol/L — ABNORMAL LOW (ref 22–32)
Calcium: 9.1 mg/dL (ref 8.9–10.3)
Chloride: 106 mmol/L (ref 98–111)
Creatinine, Ser: 1.02 mg/dL (ref 0.61–1.24)
GFR, Estimated: 49 mL/min — ABNORMAL LOW (ref >60)
Glucose, Bld: 208 mg/dL — ABNORMAL HIGH (ref 70–99)
Potassium: 2.8 mmol/L — ABNORMAL LOW (ref 3.5–5.1)
Sodium: 139 mmol/L (ref 135–145)
Total Bilirubin: 0.3 mg/dL (ref 0.3–1.2)
Total Protein: 7.3 g/dL (ref 6.5–8.1)

## 2022-02-09 LAB — I-STAT CHEM 8, ED
BUN: 8 mg/dL (ref 6–20)
Calcium, Ion: 1.1 mmol/L — ABNORMAL LOW (ref 1.15–1.40)
Chloride: 107 mmol/L (ref 98–111)
Creatinine, Ser: 1.2 mg/dL (ref 0.61–1.24)
Glucose, Bld: 204 mg/dL — ABNORMAL HIGH (ref 70–99)
HCT: 41 % (ref 39.0–52.0)
Hemoglobin: 13.9 g/dL (ref 13.0–17.0)
Potassium: 2.9 mmol/L — ABNORMAL LOW (ref 3.5–5.1)
Sodium: 144 mmol/L (ref 135–145)
TCO2: 17 mmol/L — ABNORMAL LOW (ref 22–32)

## 2022-02-09 LAB — I-STAT ARTERIAL BLOOD GAS, ED
Acid-base deficit: 9 mmol/L — ABNORMAL HIGH (ref 0.0–2.0)
Bicarbonate: 18 mmol/L — ABNORMAL LOW (ref 20.0–28.0)
Calcium, Ion: 1.17 mmol/L (ref 1.15–1.40)
HCT: 35 % — ABNORMAL LOW (ref 39.0–52.0)
Hemoglobin: 11.9 g/dL — ABNORMAL LOW (ref 13.0–17.0)
O2 Saturation: 99 %
Patient temperature: 93.4
Potassium: 2.7 mmol/L — CL (ref 3.5–5.1)
Sodium: 143 mmol/L (ref 135–145)
TCO2: 19 mmol/L — ABNORMAL LOW (ref 22–32)
pCO2 arterial: 38.3 mmHg (ref 32–48)
pH, Arterial: 7.264 — ABNORMAL LOW (ref 7.35–7.45)
pO2, Arterial: 154 mmHg — ABNORMAL HIGH (ref 83–108)

## 2022-02-09 LAB — URINALYSIS, ROUTINE W REFLEX MICROSCOPIC
Bilirubin Urine: NEGATIVE
Glucose, UA: NEGATIVE mg/dL
Ketones, ur: 5 mg/dL — AB
Leukocytes,Ua: NEGATIVE
Nitrite: NEGATIVE
Protein, ur: 100 mg/dL — AB
Specific Gravity, Urine: 1.033 — ABNORMAL HIGH (ref 1.005–1.030)
pH: 6 (ref 5.0–8.0)

## 2022-02-09 LAB — CBC
HCT: 40.2 % (ref 39.0–52.0)
Hemoglobin: 13.8 g/dL (ref 13.0–17.0)
MCH: 28.8 pg (ref 26.0–34.0)
MCHC: 34.3 g/dL (ref 30.0–36.0)
MCV: 83.8 fL (ref 80.0–100.0)
Platelets: 255 10*3/uL (ref 150–400)
RBC: 4.8 MIL/uL (ref 4.22–5.81)
RDW: 12.8 % (ref 11.5–15.5)
WBC: 14 10*3/uL — ABNORMAL HIGH (ref 4.0–10.5)
nRBC: 0 % (ref 0.0–0.2)

## 2022-02-09 LAB — ABO/RH: ABO/RH(D): O POS

## 2022-02-09 LAB — ETHANOL: Alcohol, Ethyl (B): 196 mg/dL — ABNORMAL HIGH (ref <10)

## 2022-02-09 LAB — LACTIC ACID, PLASMA: Lactic Acid, Venous: 5.9 mmol/L (ref 0.5–1.9)

## 2022-02-09 LAB — SAMPLE TO BLOOD BANK

## 2022-02-09 LAB — TRAUMA TEG PANEL
CFF Max Amplitude: 11.8 mm — ABNORMAL LOW (ref 15–32)
Citrated Kaolin (R): 6 min (ref 4.6–9.1)
Citrated Rapid TEG (MA): 52.1 mm (ref 52–70)
Lysis at 30 Minutes: 0 % (ref 0.0–2.6)

## 2022-02-09 LAB — PROTIME-INR
INR: 1.3 — ABNORMAL HIGH (ref 0.8–1.2)
Prothrombin Time: 16.1 seconds — ABNORMAL HIGH (ref 11.4–15.2)

## 2022-02-09 LAB — MRSA NEXT GEN BY PCR, NASAL: MRSA by PCR Next Gen: DETECTED — AB

## 2022-02-09 LAB — HIV ANTIBODY (ROUTINE TESTING W REFLEX): HIV Screen 4th Generation wRfx: NONREACTIVE

## 2022-02-09 SURGERY — LAPAROTOMY, EXPLORATORY
Anesthesia: General | Site: Abdomen

## 2022-02-09 MED ORDER — OXYCODONE HCL 5 MG PO TABS: 5.0000 mg | ORAL_TABLET | ORAL | Status: DC | PRN

## 2022-02-09 MED ORDER — NOREPINEPHRINE 4 MG/250ML-% IV SOLN
INTRAVENOUS | Status: AC
  Filled 2022-02-09: qty 250

## 2022-02-09 MED ORDER — PROPOFOL 1000 MG/100ML IV EMUL
INTRAVENOUS | Status: AC
  Filled 2022-02-09: qty 100

## 2022-02-09 MED ORDER — FENTANYL 2500MCG IN NS 250ML (10MCG/ML) PREMIX INFUSION
0.0000 ug/h | INTRAVENOUS | Status: DC
  Administered 2022-02-09: 25 ug/h via INTRAVENOUS
  Filled 2022-02-09: qty 250

## 2022-02-09 MED ORDER — LACTATED RINGERS IV SOLN: INTRAVENOUS | Status: DC | PRN

## 2022-02-09 MED ORDER — LIDOCAINE 2% (20 MG/ML) 5 ML SYRINGE
INTRAMUSCULAR | Status: AC
  Filled 2022-02-09: qty 5

## 2022-02-09 MED ORDER — PROPOFOL 1000 MG/100ML IV EMUL: 0.0000 ug/kg/min | INTRAVENOUS | Status: DC

## 2022-02-09 MED ORDER — VASOPRESSIN 20 UNIT/ML IV SOLN
INTRAVENOUS | Status: AC
  Filled 2022-02-09: qty 1

## 2022-02-09 MED ORDER — PROPOFOL 10 MG/ML IV BOLUS
INTRAVENOUS | Status: AC
  Filled 2022-02-09: qty 20

## 2022-02-09 MED ORDER — MIDAZOLAM HCL 2 MG/2ML IJ SOLN
INTRAMUSCULAR | Status: AC
  Filled 2022-02-09: qty 2

## 2022-02-09 MED ORDER — FUROSEMIDE 10 MG/ML IJ SOLN
INTRAMUSCULAR | Status: DC | PRN
  Administered 2022-02-09: 40 mg via INTRAMUSCULAR

## 2022-02-09 MED ORDER — ORAL CARE MOUTH RINSE
15.0000 mL | OROMUCOSAL | Status: DC
  Administered 2022-02-09 (×4): 15 mL via OROMUCOSAL

## 2022-02-09 MED ORDER — FENTANYL BOLUS VIA INFUSION: 50.0000 ug | INTRAVENOUS | Status: DC | PRN

## 2022-02-09 MED ORDER — ROCURONIUM BROMIDE 10 MG/ML (PF) SYRINGE
PREFILLED_SYRINGE | INTRAVENOUS | Status: DC | PRN
  Administered 2022-02-09: 100 mg via INTRAVENOUS

## 2022-02-09 MED ORDER — TRANEXAMIC ACID-NACL 1000-0.7 MG/100ML-% IV SOLN
INTRAVENOUS | Status: AC
  Filled 2022-02-09: qty 100

## 2022-02-09 MED ORDER — FENTANYL 2500MCG IN NS 250ML (10MCG/ML) PREMIX INFUSION
INTRAVENOUS | Status: AC
  Filled 2022-02-09: qty 250

## 2022-02-09 MED ORDER — ORAL CARE MOUTH RINSE: 15.0000 mL | OROMUCOSAL | Status: DC | PRN

## 2022-02-09 MED ORDER — FENTANYL CITRATE (PF) 100 MCG/2ML IJ SOLN
INTRAMUSCULAR | Status: AC
  Filled 2022-02-09: qty 2

## 2022-02-09 MED ORDER — ROCURONIUM BROMIDE 50 MG/5ML IV SOLN
INTRAVENOUS | Status: AC | PRN
  Administered 2022-02-09: 100 mg via INTRAVENOUS

## 2022-02-09 MED ORDER — PROPOFOL 1000 MG/100ML IV EMUL
INTRAVENOUS | Status: AC
  Administered 2022-02-09: 45 ug/kg/min via INTRAVENOUS
  Filled 2022-02-09: qty 100

## 2022-02-09 MED ORDER — FENTANYL 2500MCG IN NS 250ML (10MCG/ML) PREMIX INFUSION
50.0000 ug/h | INTRAVENOUS | Status: DC
  Administered 2022-02-09: 100 ug/h via INTRAVENOUS

## 2022-02-09 MED ORDER — LEVETIRACETAM IN NACL 500 MG/100ML IV SOLN
500.0000 mg | Freq: Two times a day (BID) | INTRAVENOUS | Status: DC
  Administered 2022-02-09 (×2): 500 mg via INTRAVENOUS
  Filled 2022-02-09 (×3): qty 100

## 2022-02-09 MED ORDER — CHLORHEXIDINE GLUCONATE CLOTH 2 % EX PADS
6.0000 | MEDICATED_PAD | Freq: Every day | CUTANEOUS | Status: DC
  Administered 2022-02-09: 6 via TOPICAL

## 2022-02-09 MED ORDER — SODIUM CHLORIDE 3 % IV BOLUS
250.0000 mL | Freq: Once | INTRAVENOUS | Status: AC
  Administered 2022-02-09: 250 mL via INTRAVENOUS
  Filled 2022-02-09 (×2): qty 500

## 2022-02-09 MED ORDER — SODIUM CHLORIDE 0.9 % IV SOLN: INTRAVENOUS | Status: DC | PRN

## 2022-02-09 MED ORDER — TRANEXAMIC ACID FOR EPISTAXIS: 500.0000 mg | Freq: Once | TOPICAL | Status: DC

## 2022-02-09 MED ORDER — MIDAZOLAM HCL 2 MG/2ML IJ SOLN
INTRAMUSCULAR | Status: DC | PRN
  Administered 2022-02-09: 2 mg via INTRAVENOUS

## 2022-02-09 MED ORDER — ONDANSETRON HCL 4 MG/2ML IJ SOLN
INTRAMUSCULAR | Status: DC | PRN
  Administered 2022-02-09: 4 mg via INTRAVENOUS

## 2022-02-09 MED ORDER — MORPHINE 100MG IN NS 100ML (1MG/ML) PREMIX INFUSION: 1.0000 mg/h | INTRAVENOUS | Status: DC

## 2022-02-09 MED ORDER — CALCIUM CHLORIDE 10 % IV SOLN
INTRAVENOUS | Status: AC
  Filled 2022-02-09: qty 20

## 2022-02-09 MED ORDER — LEVETIRACETAM IN NACL 500 MG/100ML IV SOLN: 500.0000 mg | Freq: Two times a day (BID) | INTRAVENOUS | Status: DC

## 2022-02-09 MED ORDER — PROPOFOL 1000 MG/100ML IV EMUL: 5.0000 ug/kg/min | INTRAVENOUS | Status: DC

## 2022-02-09 MED ORDER — METRONIDAZOLE 500 MG/100ML IV SOLN
INTRAVENOUS | Status: AC
  Filled 2022-02-09: qty 100

## 2022-02-09 MED ORDER — SUCCINYLCHOLINE CHLORIDE 200 MG/10ML IV SOSY
PREFILLED_SYRINGE | INTRAVENOUS | Status: AC
  Filled 2022-02-09: qty 10

## 2022-02-09 MED ORDER — DEXAMETHASONE SODIUM PHOSPHATE 10 MG/ML IJ SOLN
INTRAMUSCULAR | Status: DC | PRN
  Administered 2022-02-09: 10 mg via INTRAVENOUS

## 2022-02-09 MED ORDER — FENTANYL CITRATE (PF) 250 MCG/5ML IJ SOLN
INTRAMUSCULAR | Status: AC
  Filled 2022-02-09: qty 5

## 2022-02-09 MED ORDER — FENTANYL CITRATE PF 50 MCG/ML IJ SOSY
PREFILLED_SYRINGE | INTRAMUSCULAR | Status: AC
  Filled 2022-02-09: qty 2

## 2022-02-09 MED ORDER — ACETAMINOPHEN 500 MG PO TABS: 1000.0000 mg | ORAL_TABLET | Freq: Four times a day (QID) | ORAL | Status: DC

## 2022-02-09 MED ORDER — FENTANYL CITRATE PF 50 MCG/ML IJ SOSY: 50.0000 ug | PREFILLED_SYRINGE | Freq: Once | INTRAMUSCULAR | Status: DC

## 2022-02-09 MED ORDER — IOHEXOL 350 MG/ML SOLN
80.0000 mL | Freq: Once | INTRAVENOUS | Status: AC | PRN
  Administered 2022-02-09: 80 mL via INTRAVENOUS

## 2022-02-09 MED ORDER — ONDANSETRON HCL 4 MG/2ML IJ SOLN: 4.0000 mg | Freq: Four times a day (QID) | INTRAMUSCULAR | Status: DC | PRN

## 2022-02-09 MED ORDER — POLYETHYLENE GLYCOL 3350 17 G PO PACK: 17.0000 g | PACK | Freq: Every day | ORAL | Status: DC

## 2022-02-09 MED ORDER — ONDANSETRON 4 MG PO TBDP: 4.0000 mg | ORAL_TABLET | Freq: Four times a day (QID) | ORAL | Status: DC | PRN

## 2022-02-09 MED ORDER — NOREPINEPHRINE 4 MG/250ML-% IV SOLN
0.0000 ug/min | INTRAVENOUS | Status: DC
  Administered 2022-02-09: 11 ug/min via INTRAVENOUS
  Administered 2022-02-09: 3 ug/min via INTRAVENOUS
  Filled 2022-02-09: qty 250

## 2022-02-09 MED ORDER — MUPIROCIN 2 % EX OINT
1.0000 | TOPICAL_OINTMENT | Freq: Two times a day (BID) | CUTANEOUS | Status: DC
  Administered 2022-02-09: 1 via NASAL
  Filled 2022-02-09: qty 22

## 2022-02-09 MED ORDER — ETOMIDATE 2 MG/ML IV SOLN
INTRAVENOUS | Status: AC | PRN
  Administered 2022-02-09: 20 mg via INTRAVENOUS

## 2022-02-09 MED ORDER — ONDANSETRON HCL 4 MG/2ML IJ SOLN
INTRAMUSCULAR | Status: AC
  Filled 2022-02-09: qty 2

## 2022-02-09 MED ORDER — SODIUM CHLORIDE 0.9 % IV SOLN: INTRAVENOUS | Status: DC

## 2022-02-09 MED ORDER — SODIUM CHLORIDE 0.9 % IV SOLN
2.0000 g | Freq: Once | INTRAVENOUS | Status: AC
  Administered 2022-02-09: 2 g via INTRAVENOUS
  Filled 2022-02-09: qty 2

## 2022-02-09 MED ORDER — POTASSIUM CHLORIDE 10 MEQ/100ML IV SOLN
10.0000 meq | INTRAVENOUS | Status: AC
  Administered 2022-02-09 (×2): 10 meq via INTRAVENOUS
  Filled 2022-02-09 (×2): qty 100

## 2022-02-09 MED ORDER — SODIUM BICARBONATE 8.4 % IV SOLN
INTRAVENOUS | Status: DC | PRN
  Administered 2022-02-09: 50 meq via INTRAVENOUS

## 2022-02-09 MED ORDER — DOCUSATE SODIUM 50 MG/5ML PO LIQD: 100.0000 mg | Freq: Two times a day (BID) | ORAL | Status: DC

## 2022-02-09 SURGICAL SUPPLY — 26 items
BAG COUNTER SPONGE SURGICOUNT (BAG) ×3 IMPLANT
BAG SPNG CNTER NS LX DISP (BAG) ×3
BLADE CLIPPER SURG (BLADE) IMPLANT
CANISTER SUCT 3000ML PPV (MISCELLANEOUS) ×3 IMPLANT
COVER SURGICAL LIGHT HANDLE (MISCELLANEOUS) ×3 IMPLANT
DRAPE LAPAROSCOPIC ABDOMINAL (DRAPES) ×3 IMPLANT
DRAPE WARM FLUID 44X44 (DRAPES) ×3 IMPLANT
ELECT CAUTERY BLADE 6.4 (BLADE) ×3 IMPLANT
ELECT REM PT RETURN 9FT ADLT (ELECTROSURGICAL) ×3
ELECTRODE REM PT RTRN 9FT ADLT (ELECTROSURGICAL) ×3 IMPLANT
GAUZE SPONGE 4X4 12PLY STRL (GAUZE/BANDAGES/DRESSINGS) IMPLANT
GLOVE BIO SURGEON STRL SZ 6.5 (GLOVE) IMPLANT
GLOVE BIOGEL PI IND STRL 6.5 (GLOVE) IMPLANT
GOWN STRL REUS W/ TWL LRG LVL3 (GOWN DISPOSABLE) ×3 IMPLANT
GOWN STRL REUS W/TWL LRG LVL3 (GOWN DISPOSABLE) ×9
HANDLE SUCTION POOLE (INSTRUMENTS) ×3 IMPLANT
KIT BASIN OR (CUSTOM PROCEDURE TRAY) ×3 IMPLANT
KIT TURNOVER KIT B (KITS) ×3 IMPLANT
NS IRRIG 1000ML POUR BTL (IV SOLUTION) ×6 IMPLANT
PACK GENERAL/GYN (CUSTOM PROCEDURE TRAY) ×3 IMPLANT
PAD ARMBOARD 7.5X6 YLW CONV (MISCELLANEOUS) ×3 IMPLANT
SPONGE T-LAP 18X18 ~~LOC~~+RFID (SPONGE) IMPLANT
SUCTION POOLE HANDLE (INSTRUMENTS) ×3
SUT SILK 3 0 TIES 10X30 (SUTURE) ×3 IMPLANT
TOWEL GREEN STERILE (TOWEL DISPOSABLE) ×3 IMPLANT
TRAY CHEST TUBE INSERTION DISP (SET/KITS/TRAYS/PACK) IMPLANT

## 2022-02-10 ENCOUNTER — Encounter (HOSPITAL_COMMUNITY): Payer: Self-pay | Admitting: Surgery

## 2022-02-10 LAB — BPAM FFP
Blood Product Expiration Date: 202310092359
Blood Product Expiration Date: 202310122359
Blood Product Expiration Date: 202310132359
Blood Product Expiration Date: 202310132359
Blood Product Expiration Date: 202310132359
Blood Product Expiration Date: 202310132359
Blood Product Expiration Date: 202310132359
Blood Product Expiration Date: 202310132359
Blood Product Expiration Date: 202310132359
Blood Product Expiration Date: 202310132359
Blood Product Expiration Date: 202310132359
Blood Product Expiration Date: 202310132359
Blood Product Expiration Date: 202310132359
Blood Product Expiration Date: 202310132359
Blood Product Expiration Date: 202310132359
Blood Product Expiration Date: 202310132359
Blood Product Expiration Date: 202310132359
Blood Product Expiration Date: 202310132359
Blood Product Expiration Date: 202310132359
Blood Product Expiration Date: 202310182359
ISSUE DATE / TIME: 202310080625
ISSUE DATE / TIME: 202310080625
ISSUE DATE / TIME: 202310080625
ISSUE DATE / TIME: 202310080625
ISSUE DATE / TIME: 202310080710
ISSUE DATE / TIME: 202310080710
ISSUE DATE / TIME: 202310080710
ISSUE DATE / TIME: 202310080711
ISSUE DATE / TIME: 202310080731
ISSUE DATE / TIME: 202310080731
ISSUE DATE / TIME: 202310090621
ISSUE DATE / TIME: 202310090621
ISSUE DATE / TIME: 202310090621
ISSUE DATE / TIME: 202310090621
Unit Type and Rh: 5100
Unit Type and Rh: 600
Unit Type and Rh: 600
Unit Type and Rh: 600
Unit Type and Rh: 6200
Unit Type and Rh: 6200
Unit Type and Rh: 6200
Unit Type and Rh: 6200
Unit Type and Rh: 6200
Unit Type and Rh: 6200
Unit Type and Rh: 6200
Unit Type and Rh: 6200
Unit Type and Rh: 6200
Unit Type and Rh: 6200
Unit Type and Rh: 6200
Unit Type and Rh: 6200
Unit Type and Rh: 6200
Unit Type and Rh: 6200
Unit Type and Rh: 6200
Unit Type and Rh: 6200

## 2022-02-10 LAB — TYPE AND SCREEN
ABO/RH(D): O POS
Antibody Screen: NEGATIVE
Unit division: 0
Unit division: 0
Unit division: 0
Unit division: 0
Unit division: 0
Unit division: 0
Unit division: 0
Unit division: 0
Unit division: 0
Unit division: 0
Unit division: 0
Unit division: 0
Unit division: 0
Unit division: 0
Unit division: 0
Unit division: 0
Unit division: 0
Unit division: 0
Unit division: 0
Unit division: 0
Unit division: 0
Unit division: 0
Unit division: 0
Unit division: 0
Unit division: 0
Unit division: 0
Unit division: 0
Unit division: 0
Unit division: 0
Unit division: 0
Unit division: 0
Unit division: 0
Unit division: 0
Unit division: 0
Unit division: 0
Unit division: 0
Unit division: 0
Unit division: 0

## 2022-02-10 LAB — BPAM PLATELET PHERESIS
Blood Product Expiration Date: 202310102359
Blood Product Expiration Date: 202310102359
ISSUE DATE / TIME: 202310080626
ISSUE DATE / TIME: 202310080639
Unit Type and Rh: 5100
Unit Type and Rh: 6200

## 2022-02-10 LAB — BPAM RBC
Blood Product Expiration Date: 202311032359
Blood Product Expiration Date: 202311042359
Blood Product Expiration Date: 202311042359
Blood Product Expiration Date: 202311042359
Blood Product Expiration Date: 202311042359
Blood Product Expiration Date: 202311042359
Blood Product Expiration Date: 202311052359
Blood Product Expiration Date: 202311052359
Blood Product Expiration Date: 202311052359
Blood Product Expiration Date: 202311052359
Blood Product Expiration Date: 202311052359
Blood Product Expiration Date: 202311052359
Blood Product Expiration Date: 202311052359
Blood Product Expiration Date: 202311052359
Blood Product Expiration Date: 202311052359
Blood Product Expiration Date: 202311052359
Blood Product Expiration Date: 202311062359
Blood Product Expiration Date: 202311062359
Blood Product Expiration Date: 202311062359
Blood Product Expiration Date: 202311062359
Blood Product Expiration Date: 202311062359
Blood Product Expiration Date: 202311062359
Blood Product Expiration Date: 202311062359
Blood Product Expiration Date: 202311062359
Blood Product Expiration Date: 202311062359
Blood Product Expiration Date: 202311062359
Blood Product Expiration Date: 202311062359
Blood Product Expiration Date: 202311062359
Blood Product Expiration Date: 202311062359
Blood Product Expiration Date: 202311062359
Blood Product Expiration Date: 202311092359
Blood Product Expiration Date: 202311092359
Blood Product Expiration Date: 202311112359
Blood Product Expiration Date: 202311112359
Blood Product Expiration Date: 202311112359
Blood Product Expiration Date: 202311112359
Blood Product Expiration Date: 202311112359
Blood Product Expiration Date: 202311112359
ISSUE DATE / TIME: 202310080620
ISSUE DATE / TIME: 202310080620
ISSUE DATE / TIME: 202310080625
ISSUE DATE / TIME: 202310080625
ISSUE DATE / TIME: 202310080625
ISSUE DATE / TIME: 202310080625
ISSUE DATE / TIME: 202310080635
ISSUE DATE / TIME: 202310080635
ISSUE DATE / TIME: 202310080635
ISSUE DATE / TIME: 202310080635
ISSUE DATE / TIME: 202310080646
ISSUE DATE / TIME: 202310080646
ISSUE DATE / TIME: 202310080646
ISSUE DATE / TIME: 202310080646
ISSUE DATE / TIME: 202310080700
ISSUE DATE / TIME: 202310080713
ISSUE DATE / TIME: 202310080713
ISSUE DATE / TIME: 202310080718
ISSUE DATE / TIME: 202310080718
ISSUE DATE / TIME: 202310080718
ISSUE DATE / TIME: 202310080718
ISSUE DATE / TIME: 202310080718
ISSUE DATE / TIME: 202310080718
ISSUE DATE / TIME: 202310080727
ISSUE DATE / TIME: 202310080727
ISSUE DATE / TIME: 202310080727
ISSUE DATE / TIME: 202310080727
ISSUE DATE / TIME: 202310080727
ISSUE DATE / TIME: 202310080727
ISSUE DATE / TIME: 202310080727
ISSUE DATE / TIME: 202310080727
ISSUE DATE / TIME: 202310080729
ISSUE DATE / TIME: 202310080729
ISSUE DATE / TIME: 202310090206
ISSUE DATE / TIME: 202310090206
ISSUE DATE / TIME: 202310091128
ISSUE DATE / TIME: 202310091221
ISSUE DATE / TIME: 202310091221
Unit Type and Rh: 5100
Unit Type and Rh: 5100
Unit Type and Rh: 5100
Unit Type and Rh: 5100
Unit Type and Rh: 5100
Unit Type and Rh: 5100
Unit Type and Rh: 5100
Unit Type and Rh: 5100
Unit Type and Rh: 5100
Unit Type and Rh: 5100
Unit Type and Rh: 5100
Unit Type and Rh: 5100
Unit Type and Rh: 5100
Unit Type and Rh: 5100
Unit Type and Rh: 5100
Unit Type and Rh: 5100
Unit Type and Rh: 5100
Unit Type and Rh: 5100
Unit Type and Rh: 5100
Unit Type and Rh: 5100
Unit Type and Rh: 5100
Unit Type and Rh: 5100
Unit Type and Rh: 5100
Unit Type and Rh: 5100
Unit Type and Rh: 5100
Unit Type and Rh: 5100
Unit Type and Rh: 5100
Unit Type and Rh: 5100
Unit Type and Rh: 5100
Unit Type and Rh: 5100
Unit Type and Rh: 5100
Unit Type and Rh: 5100
Unit Type and Rh: 5100
Unit Type and Rh: 5100
Unit Type and Rh: 5100
Unit Type and Rh: 5100
Unit Type and Rh: 5100
Unit Type and Rh: 5100

## 2022-02-10 LAB — PREPARE FRESH FROZEN PLASMA
Unit division: 0
Unit division: 0
Unit division: 0
Unit division: 0
Unit division: 0
Unit division: 0
Unit division: 0
Unit division: 0
Unit division: 0
Unit division: 0
Unit division: 0
Unit division: 0
Unit division: 0
Unit division: 0
Unit division: 0
Unit division: 0

## 2022-02-10 LAB — PREPARE PLATELET PHERESIS
Unit division: 0
Unit division: 0
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
## 2022-03-05 NOTE — Procedures (Addendum)
   Procedure Note  Date: 02/13/22  Procedure: central venous catheter placement--right, femoral vein, without ultrasound guidance  Pre-op diagnosis: hypotension, need for administration of vasopressors Post-op diagnosis: same  Surgeon: Jesusita Oka, MD  Anesthesia: local  EBL: <5cc Drains/Implants:  single  lumen central venous catheter  Description of procedure: Time-out was performed verifying correct patient, procedure, site, laterality, and signature of informed consent. The right groin was prepped and draped in the usual sterile fashion. The right femoral vein was localized using anatomic landmarks, accessed using an introducer needle, and a guidewire passed through the needle. The needle was removed and a skin nick was made. The tract was dilated and the central venous catheter advanced over the guidewire followed by removal of the guidewire. All ports drew blood easily and all were flushed with saline. The catheter was secured to the skin with suture.   Jesusita Oka, MD General and [facility]
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
## 2022-03-05 NOTE — Progress Notes (Signed)
RT called to room due to patient had a change in breathing pattern.  Dr. Leonette Monarch at bedside.
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
## 2022-03-05 NOTE — ED Notes (Signed)
Noted a change in resp status appeared to paradoxical breathing. Dr. Leonette Monarch made aware and at bedside. Resp at bedside as well

## 2022-03-05 NOTE — Progress Notes (Signed)
RT note-Called for transport to OR from ED. Patient was suctioned for moderate amount of blood with difficulty to pass catheter towards the distal end of the ETT. RN notified prior to transport and chest xray ordered before leaving the ED. Placement was confirmed with L chest whited out. High PIP's, suctioned again. Once arriving to OR, difficulty ventilating with low VE and VT. Anesthesia aware and was then placed on the OR's ventilation system. Sp02 had been 56% now 96% with manual ventilation by anesthesia. Ventilator was left in OR for transport.
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
## 2022-03-05 NOTE — Anesthesia Procedure Notes (Signed)
Arterial Line Insertion Start/End2023/10/29 7:08 AM, 03-02-22 7:10 AM Performed by: Babs Bertin, CRNA, CRNA  Patient location: OR. Preanesthetic checklist: patient identified and IV checked Left, radial was placed Catheter size: 20 G Hand hygiene performed  and maximum sterile barriers used   Attempts: 1 Procedure performed without using ultrasound guided technique. Ultrasound Notes:anatomy identified Following insertion, dressing applied and Biopatch.
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
## 2022-03-05 NOTE — Transfer of Care (Signed)
Immediate Anesthesia Transfer of Care Note  Patient: Dylan Meyer  Procedure(s) Performed: EXPLORATORY LAPAROTOMY; REPAIR OF SMALL BOWEL MESENTERIC INJURY X 2 (Abdomen) CHEST TUBE INSERTION (Left) APPLICATION OF WOUND VAC ABTHERA (Abdomen)  Patient Location: ICU  Anesthesia Type:General  Level of Consciousness: Patient remains intubated per anesthesia plan  Airway & Oxygen Therapy: Patient remains intubated per anesthesia plan and Patient placed on Ventilator (see vital sign flow sheet for setting)  Post-op Assessment: Report given to RN and Post -op Vital signs reviewed and stable  Post vital signs: Reviewed and stable  Last Vitals:  Vitals Value Taken Time  BP 113/53 24-Feb-2022 0830  Temp    Pulse 117 2022/02/24 0831  Resp 17 February 24, 2022 0831  SpO2 91 % Feb 24, 2022 0831  Vitals shown include unvalidated device data.  Last Pain: There were no vitals filed for this visit.       Complications: No notable events documented.
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
## 2022-03-05 NOTE — Progress Notes (Signed)
Clinical update provided to multiple family members. Patient has no HCPOA, is unmarried, no adult children, and father is deceased. Mother identified as Radio broadcast assistant. Discussed devastating brain injury and anticipated progression to brain death. Communicated intra-operative findings. Counseled on futility of chest compressions and advised of code status change to no compressions due to futility. Hypotensive shortly after arrival to ICU, levophed started to allow family to visit. [facility] notified. Continue end-of-life discussions and preparation of family.   Critical care time: 97min  Jesusita Oka, MD General and [facility]

## 2022-03-05 NOTE — Progress Notes (Signed)
RT NOTE:  Pt transported on vent to CT without complication.
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
## 2022-03-05 NOTE — Procedures (Signed)
Extubation Procedure Note  Patient Details:   Name: Dylan Meyer DOB: 08-Jun-1995 MRN: [id number]   Airway Documentation:    Vent end date: 03-07-22 Vent end time: 9675   Evaluation  Patient extubated per comfort care measures. RN and family at bedside.  [name] [name] 07-Mar-2022, 5:43 PM

## 2022-03-05 NOTE — Anesthesia Postprocedure Evaluation (Signed)
Anesthesia Post Note  Patient: Dylan Meyer  Procedure(s) Performed: EXPLORATORY LAPAROTOMY; REPAIR OF SMALL BOWEL MESENTERIC INJURY X 2 (Abdomen) CHEST TUBE INSERTION (Left) APPLICATION OF WOUND VAC ABTHERA (Abdomen)     Patient location during evaluation: ICU Anesthesia Type: General Level of consciousness: patient remains intubated per anesthesia plan Pain management: pain level controlled Vital Signs Assessment: post-procedure vital signs reviewed and stable Respiratory status: patient remains intubated per anesthesia plan Cardiovascular status: stable Postop Assessment: no apparent nausea or vomiting Anesthetic complications: no   No notable events documented.  Last Vitals:  Vitals:   Feb 17, 2022 0830 02/17/22 0845  BP: (!) 113/53 (!) 107/50  Pulse: (!) 116 (!) 115  Resp: 20 20  Temp:  (!) 34.3 C  SpO2: 90% 92%    Last Pain:  Vitals:   02-17-2022 0845  TempSrc: Bladder                 Cherelle Midkiff

## 2022-03-05 NOTE — ED Notes (Signed)
Patient transported to CT 
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
## 2022-03-05 NOTE — ED Notes (Signed)
Change in pt vital signs noted. Increased HR and elevated BP. Provider aware of changes and at bedside to assess.

## 2022-03-05 NOTE — Progress Notes (Signed)
Pt transported to 4N from OR via ventilator with no complications noted. Harrell Lark RRT at bedside upon arrival.

## 2022-03-05 NOTE — Progress Notes (Signed)
Orthopedic Tech Progress Note Patient Details:  Dylan Meyer 05/05/1875 [id number]  Patient ID: Dylan Meyer, male   DOB: 05/05/1875, 26 y.o.   MRN: [id number] I attended trauma page. Karolee Stamps 03-07-22, 3:00 AM

## 2022-03-05 DEATH — deceased
